# Patient Record
Sex: Female | Born: 1935 | Race: White | Hispanic: No | Smoking: Never smoker
Health system: Southern US, Community
[De-identification: ages and names within clinical notes are randomized; demographics above are authoritative.]

## PROBLEM LIST (undated history)

## (undated) DIAGNOSIS — I639 Cerebral infarction, unspecified: Secondary | ICD-10-CM

## (undated) DIAGNOSIS — I10 Essential (primary) hypertension: Secondary | ICD-10-CM

## (undated) DIAGNOSIS — E119 Type 2 diabetes mellitus without complications: Secondary | ICD-10-CM

## (undated) HISTORY — PX: EYE SURGERY: SHX253

## (undated) HISTORY — PX: ABDOMINAL HYSTERECTOMY: SHX81

## (undated) HISTORY — PX: CHOLECYSTECTOMY: SHX55

## (undated) HISTORY — PX: TONSILLECTOMY: SUR1361

---

## 2014-02-20 DIAGNOSIS — E119 Type 2 diabetes mellitus without complications: Secondary | ICD-10-CM | POA: Diagnosis not present

## 2014-02-20 DIAGNOSIS — D539 Nutritional anemia, unspecified: Secondary | ICD-10-CM | POA: Diagnosis not present

## 2014-04-25 DIAGNOSIS — E119 Type 2 diabetes mellitus without complications: Secondary | ICD-10-CM | POA: Diagnosis not present

## 2014-05-01 DIAGNOSIS — I10 Essential (primary) hypertension: Secondary | ICD-10-CM | POA: Diagnosis not present

## 2014-05-01 DIAGNOSIS — M810 Age-related osteoporosis without current pathological fracture: Secondary | ICD-10-CM | POA: Diagnosis not present

## 2014-05-01 DIAGNOSIS — E1143 Type 2 diabetes mellitus with diabetic autonomic (poly)neuropathy: Secondary | ICD-10-CM | POA: Diagnosis not present

## 2014-05-01 DIAGNOSIS — E780 Pure hypercholesterolemia: Secondary | ICD-10-CM | POA: Diagnosis not present

## 2014-06-06 DIAGNOSIS — Z0000 Encounter for general adult medical examination without abnormal findings: Secondary | ICD-10-CM | POA: Diagnosis not present

## 2014-06-06 DIAGNOSIS — E1143 Type 2 diabetes mellitus with diabetic autonomic (poly)neuropathy: Secondary | ICD-10-CM | POA: Diagnosis not present

## 2014-06-06 DIAGNOSIS — Z1389 Encounter for screening for other disorder: Secondary | ICD-10-CM | POA: Diagnosis not present

## 2014-06-06 DIAGNOSIS — Z9181 History of falling: Secondary | ICD-10-CM | POA: Diagnosis not present

## 2014-06-06 DIAGNOSIS — E780 Pure hypercholesterolemia: Secondary | ICD-10-CM | POA: Diagnosis not present

## 2014-06-21 DIAGNOSIS — I421 Obstructive hypertrophic cardiomyopathy: Secondary | ICD-10-CM | POA: Diagnosis not present

## 2014-06-21 DIAGNOSIS — E119 Type 2 diabetes mellitus without complications: Secondary | ICD-10-CM | POA: Diagnosis not present

## 2014-06-29 DIAGNOSIS — I429 Cardiomyopathy, unspecified: Secondary | ICD-10-CM | POA: Diagnosis not present

## 2014-07-06 DIAGNOSIS — I429 Cardiomyopathy, unspecified: Secondary | ICD-10-CM | POA: Diagnosis not present

## 2014-07-10 DIAGNOSIS — I421 Obstructive hypertrophic cardiomyopathy: Secondary | ICD-10-CM | POA: Diagnosis not present

## 2014-07-10 DIAGNOSIS — E119 Type 2 diabetes mellitus without complications: Secondary | ICD-10-CM | POA: Diagnosis not present

## 2014-07-10 DIAGNOSIS — I429 Cardiomyopathy, unspecified: Secondary | ICD-10-CM | POA: Diagnosis not present

## 2014-07-25 DIAGNOSIS — E119 Type 2 diabetes mellitus without complications: Secondary | ICD-10-CM | POA: Diagnosis not present

## 2014-07-26 DIAGNOSIS — E1165 Type 2 diabetes mellitus with hyperglycemia: Secondary | ICD-10-CM | POA: Diagnosis not present

## 2014-07-26 DIAGNOSIS — E780 Pure hypercholesterolemia: Secondary | ICD-10-CM | POA: Diagnosis not present

## 2014-07-26 DIAGNOSIS — I10 Essential (primary) hypertension: Secondary | ICD-10-CM | POA: Diagnosis not present

## 2014-07-26 DIAGNOSIS — E871 Hypo-osmolality and hyponatremia: Secondary | ICD-10-CM | POA: Diagnosis not present

## 2014-07-31 DIAGNOSIS — E039 Hypothyroidism, unspecified: Secondary | ICD-10-CM | POA: Diagnosis not present

## 2014-07-31 DIAGNOSIS — E871 Hypo-osmolality and hyponatremia: Secondary | ICD-10-CM | POA: Diagnosis not present

## 2014-07-31 DIAGNOSIS — I8310 Varicose veins of unspecified lower extremity with inflammation: Secondary | ICD-10-CM | POA: Diagnosis not present

## 2014-08-08 DIAGNOSIS — I421 Obstructive hypertrophic cardiomyopathy: Secondary | ICD-10-CM | POA: Diagnosis not present

## 2014-08-08 DIAGNOSIS — E119 Type 2 diabetes mellitus without complications: Secondary | ICD-10-CM | POA: Diagnosis not present

## 2014-08-08 DIAGNOSIS — I10 Essential (primary) hypertension: Secondary | ICD-10-CM | POA: Diagnosis not present

## 2014-08-08 DIAGNOSIS — I429 Cardiomyopathy, unspecified: Secondary | ICD-10-CM | POA: Diagnosis not present

## 2014-08-14 DIAGNOSIS — I350 Nonrheumatic aortic (valve) stenosis: Secondary | ICD-10-CM | POA: Diagnosis not present

## 2014-08-14 DIAGNOSIS — I340 Nonrheumatic mitral (valve) insufficiency: Secondary | ICD-10-CM | POA: Diagnosis not present

## 2014-08-14 DIAGNOSIS — I517 Cardiomegaly: Secondary | ICD-10-CM | POA: Diagnosis not present

## 2014-08-14 DIAGNOSIS — I10 Essential (primary) hypertension: Secondary | ICD-10-CM | POA: Diagnosis not present

## 2014-08-28 DIAGNOSIS — I10 Essential (primary) hypertension: Secondary | ICD-10-CM | POA: Diagnosis not present

## 2014-08-28 DIAGNOSIS — E1143 Type 2 diabetes mellitus with diabetic autonomic (poly)neuropathy: Secondary | ICD-10-CM | POA: Diagnosis not present

## 2014-08-28 DIAGNOSIS — E780 Pure hypercholesterolemia: Secondary | ICD-10-CM | POA: Diagnosis not present

## 2014-09-15 DIAGNOSIS — L049 Acute lymphadenitis, unspecified: Secondary | ICD-10-CM | POA: Diagnosis not present

## 2014-10-31 DIAGNOSIS — I10 Essential (primary) hypertension: Secondary | ICD-10-CM | POA: Diagnosis not present

## 2014-10-31 DIAGNOSIS — E780 Pure hypercholesterolemia: Secondary | ICD-10-CM | POA: Diagnosis not present

## 2014-10-31 DIAGNOSIS — Z23 Encounter for immunization: Secondary | ICD-10-CM | POA: Diagnosis not present

## 2014-10-31 DIAGNOSIS — E1165 Type 2 diabetes mellitus with hyperglycemia: Secondary | ICD-10-CM | POA: Diagnosis not present

## 2014-11-03 DIAGNOSIS — I10 Essential (primary) hypertension: Secondary | ICD-10-CM | POA: Diagnosis not present

## 2014-11-09 DIAGNOSIS — I10 Essential (primary) hypertension: Secondary | ICD-10-CM | POA: Diagnosis not present

## 2014-11-17 DIAGNOSIS — I10 Essential (primary) hypertension: Secondary | ICD-10-CM | POA: Diagnosis not present

## 2014-11-17 DIAGNOSIS — E1165 Type 2 diabetes mellitus with hyperglycemia: Secondary | ICD-10-CM | POA: Diagnosis not present

## 2014-11-17 DIAGNOSIS — N189 Chronic kidney disease, unspecified: Secondary | ICD-10-CM | POA: Diagnosis not present

## 2014-11-24 DIAGNOSIS — E871 Hypo-osmolality and hyponatremia: Secondary | ICD-10-CM | POA: Diagnosis not present

## 2014-11-27 DIAGNOSIS — N289 Disorder of kidney and ureter, unspecified: Secondary | ICD-10-CM | POA: Diagnosis not present

## 2014-11-27 DIAGNOSIS — N390 Urinary tract infection, site not specified: Secondary | ICD-10-CM | POA: Diagnosis not present

## 2014-11-27 DIAGNOSIS — G9341 Metabolic encephalopathy: Secondary | ICD-10-CM | POA: Diagnosis not present

## 2014-11-28 DIAGNOSIS — R531 Weakness: Secondary | ICD-10-CM | POA: Diagnosis not present

## 2014-11-28 DIAGNOSIS — R7989 Other specified abnormal findings of blood chemistry: Secondary | ICD-10-CM | POA: Diagnosis not present

## 2014-11-28 DIAGNOSIS — N170 Acute kidney failure with tubular necrosis: Secondary | ICD-10-CM | POA: Diagnosis not present

## 2014-11-28 DIAGNOSIS — N390 Urinary tract infection, site not specified: Secondary | ICD-10-CM | POA: Diagnosis not present

## 2014-11-28 DIAGNOSIS — I10 Essential (primary) hypertension: Secondary | ICD-10-CM | POA: Diagnosis not present

## 2014-11-28 DIAGNOSIS — N289 Disorder of kidney and ureter, unspecified: Secondary | ICD-10-CM | POA: Diagnosis not present

## 2014-11-28 DIAGNOSIS — I5022 Chronic systolic (congestive) heart failure: Secondary | ICD-10-CM | POA: Diagnosis not present

## 2014-11-28 DIAGNOSIS — G9341 Metabolic encephalopathy: Secondary | ICD-10-CM | POA: Diagnosis not present

## 2014-11-28 DIAGNOSIS — E785 Hyperlipidemia, unspecified: Secondary | ICD-10-CM | POA: Diagnosis not present

## 2014-11-28 DIAGNOSIS — R262 Difficulty in walking, not elsewhere classified: Secondary | ICD-10-CM | POA: Diagnosis not present

## 2014-11-28 DIAGNOSIS — E876 Hypokalemia: Secondary | ICD-10-CM | POA: Diagnosis not present

## 2014-12-02 DIAGNOSIS — I5022 Chronic systolic (congestive) heart failure: Secondary | ICD-10-CM | POA: Diagnosis not present

## 2014-12-02 DIAGNOSIS — E119 Type 2 diabetes mellitus without complications: Secondary | ICD-10-CM | POA: Diagnosis not present

## 2014-12-02 DIAGNOSIS — Z7984 Long term (current) use of oral hypoglycemic drugs: Secondary | ICD-10-CM | POA: Diagnosis not present

## 2014-12-02 DIAGNOSIS — I10 Essential (primary) hypertension: Secondary | ICD-10-CM | POA: Diagnosis not present

## 2014-12-02 DIAGNOSIS — R269 Unspecified abnormalities of gait and mobility: Secondary | ICD-10-CM | POA: Diagnosis not present

## 2014-12-03 DIAGNOSIS — R269 Unspecified abnormalities of gait and mobility: Secondary | ICD-10-CM | POA: Diagnosis not present

## 2014-12-03 DIAGNOSIS — E119 Type 2 diabetes mellitus without complications: Secondary | ICD-10-CM | POA: Diagnosis not present

## 2014-12-03 DIAGNOSIS — Z7984 Long term (current) use of oral hypoglycemic drugs: Secondary | ICD-10-CM | POA: Diagnosis not present

## 2014-12-03 DIAGNOSIS — I10 Essential (primary) hypertension: Secondary | ICD-10-CM | POA: Diagnosis not present

## 2014-12-03 DIAGNOSIS — I5022 Chronic systolic (congestive) heart failure: Secondary | ICD-10-CM | POA: Diagnosis not present

## 2014-12-04 DIAGNOSIS — I5022 Chronic systolic (congestive) heart failure: Secondary | ICD-10-CM | POA: Diagnosis not present

## 2014-12-04 DIAGNOSIS — R269 Unspecified abnormalities of gait and mobility: Secondary | ICD-10-CM | POA: Diagnosis not present

## 2014-12-04 DIAGNOSIS — I10 Essential (primary) hypertension: Secondary | ICD-10-CM | POA: Diagnosis not present

## 2014-12-04 DIAGNOSIS — Z7984 Long term (current) use of oral hypoglycemic drugs: Secondary | ICD-10-CM | POA: Diagnosis not present

## 2014-12-04 DIAGNOSIS — E119 Type 2 diabetes mellitus without complications: Secondary | ICD-10-CM | POA: Diagnosis not present

## 2014-12-05 DIAGNOSIS — R269 Unspecified abnormalities of gait and mobility: Secondary | ICD-10-CM | POA: Diagnosis not present

## 2014-12-05 DIAGNOSIS — Z7984 Long term (current) use of oral hypoglycemic drugs: Secondary | ICD-10-CM | POA: Diagnosis not present

## 2014-12-05 DIAGNOSIS — I5022 Chronic systolic (congestive) heart failure: Secondary | ICD-10-CM | POA: Diagnosis not present

## 2014-12-05 DIAGNOSIS — I10 Essential (primary) hypertension: Secondary | ICD-10-CM | POA: Diagnosis not present

## 2014-12-05 DIAGNOSIS — E119 Type 2 diabetes mellitus without complications: Secondary | ICD-10-CM | POA: Diagnosis not present

## 2014-12-06 DIAGNOSIS — E1143 Type 2 diabetes mellitus with diabetic autonomic (poly)neuropathy: Secondary | ICD-10-CM | POA: Diagnosis not present

## 2014-12-06 DIAGNOSIS — K219 Gastro-esophageal reflux disease without esophagitis: Secondary | ICD-10-CM | POA: Diagnosis not present

## 2014-12-06 DIAGNOSIS — E119 Type 2 diabetes mellitus without complications: Secondary | ICD-10-CM | POA: Diagnosis not present

## 2014-12-06 DIAGNOSIS — N390 Urinary tract infection, site not specified: Secondary | ICD-10-CM | POA: Diagnosis not present

## 2014-12-06 DIAGNOSIS — I5022 Chronic systolic (congestive) heart failure: Secondary | ICD-10-CM | POA: Diagnosis not present

## 2014-12-06 DIAGNOSIS — I10 Essential (primary) hypertension: Secondary | ICD-10-CM | POA: Diagnosis not present

## 2014-12-06 DIAGNOSIS — R269 Unspecified abnormalities of gait and mobility: Secondary | ICD-10-CM | POA: Diagnosis not present

## 2014-12-06 DIAGNOSIS — Z7984 Long term (current) use of oral hypoglycemic drugs: Secondary | ICD-10-CM | POA: Diagnosis not present

## 2014-12-07 DIAGNOSIS — E119 Type 2 diabetes mellitus without complications: Secondary | ICD-10-CM | POA: Diagnosis not present

## 2014-12-07 DIAGNOSIS — I5022 Chronic systolic (congestive) heart failure: Secondary | ICD-10-CM | POA: Diagnosis not present

## 2014-12-07 DIAGNOSIS — R269 Unspecified abnormalities of gait and mobility: Secondary | ICD-10-CM | POA: Diagnosis not present

## 2014-12-07 DIAGNOSIS — I10 Essential (primary) hypertension: Secondary | ICD-10-CM | POA: Diagnosis not present

## 2014-12-07 DIAGNOSIS — Z7984 Long term (current) use of oral hypoglycemic drugs: Secondary | ICD-10-CM | POA: Diagnosis not present

## 2014-12-11 DIAGNOSIS — I10 Essential (primary) hypertension: Secondary | ICD-10-CM | POA: Diagnosis not present

## 2014-12-11 DIAGNOSIS — E119 Type 2 diabetes mellitus without complications: Secondary | ICD-10-CM | POA: Diagnosis not present

## 2014-12-11 DIAGNOSIS — I5022 Chronic systolic (congestive) heart failure: Secondary | ICD-10-CM | POA: Diagnosis not present

## 2014-12-11 DIAGNOSIS — Z7984 Long term (current) use of oral hypoglycemic drugs: Secondary | ICD-10-CM | POA: Diagnosis not present

## 2014-12-11 DIAGNOSIS — R269 Unspecified abnormalities of gait and mobility: Secondary | ICD-10-CM | POA: Diagnosis not present

## 2014-12-12 DIAGNOSIS — N189 Chronic kidney disease, unspecified: Secondary | ICD-10-CM | POA: Diagnosis not present

## 2014-12-13 DIAGNOSIS — E119 Type 2 diabetes mellitus without complications: Secondary | ICD-10-CM | POA: Diagnosis not present

## 2014-12-13 DIAGNOSIS — I10 Essential (primary) hypertension: Secondary | ICD-10-CM | POA: Diagnosis not present

## 2014-12-13 DIAGNOSIS — R269 Unspecified abnormalities of gait and mobility: Secondary | ICD-10-CM | POA: Diagnosis not present

## 2014-12-13 DIAGNOSIS — Z7984 Long term (current) use of oral hypoglycemic drugs: Secondary | ICD-10-CM | POA: Diagnosis not present

## 2014-12-13 DIAGNOSIS — I5022 Chronic systolic (congestive) heart failure: Secondary | ICD-10-CM | POA: Diagnosis not present

## 2014-12-14 DIAGNOSIS — R269 Unspecified abnormalities of gait and mobility: Secondary | ICD-10-CM | POA: Diagnosis not present

## 2014-12-14 DIAGNOSIS — I10 Essential (primary) hypertension: Secondary | ICD-10-CM | POA: Diagnosis not present

## 2014-12-14 DIAGNOSIS — I5022 Chronic systolic (congestive) heart failure: Secondary | ICD-10-CM | POA: Diagnosis not present

## 2014-12-14 DIAGNOSIS — E119 Type 2 diabetes mellitus without complications: Secondary | ICD-10-CM | POA: Diagnosis not present

## 2014-12-14 DIAGNOSIS — Z7984 Long term (current) use of oral hypoglycemic drugs: Secondary | ICD-10-CM | POA: Diagnosis not present

## 2014-12-18 DIAGNOSIS — R269 Unspecified abnormalities of gait and mobility: Secondary | ICD-10-CM | POA: Diagnosis not present

## 2014-12-18 DIAGNOSIS — Z7984 Long term (current) use of oral hypoglycemic drugs: Secondary | ICD-10-CM | POA: Diagnosis not present

## 2014-12-18 DIAGNOSIS — I10 Essential (primary) hypertension: Secondary | ICD-10-CM | POA: Diagnosis not present

## 2014-12-18 DIAGNOSIS — E119 Type 2 diabetes mellitus without complications: Secondary | ICD-10-CM | POA: Diagnosis not present

## 2014-12-18 DIAGNOSIS — I5022 Chronic systolic (congestive) heart failure: Secondary | ICD-10-CM | POA: Diagnosis not present

## 2014-12-19 DIAGNOSIS — I10 Essential (primary) hypertension: Secondary | ICD-10-CM | POA: Diagnosis not present

## 2014-12-19 DIAGNOSIS — E119 Type 2 diabetes mellitus without complications: Secondary | ICD-10-CM | POA: Diagnosis not present

## 2014-12-19 DIAGNOSIS — I5022 Chronic systolic (congestive) heart failure: Secondary | ICD-10-CM | POA: Diagnosis not present

## 2014-12-19 DIAGNOSIS — R269 Unspecified abnormalities of gait and mobility: Secondary | ICD-10-CM | POA: Diagnosis not present

## 2014-12-19 DIAGNOSIS — E872 Acidosis: Secondary | ICD-10-CM | POA: Diagnosis not present

## 2014-12-19 DIAGNOSIS — Z7984 Long term (current) use of oral hypoglycemic drugs: Secondary | ICD-10-CM | POA: Diagnosis not present

## 2014-12-25 DIAGNOSIS — E119 Type 2 diabetes mellitus without complications: Secondary | ICD-10-CM | POA: Diagnosis not present

## 2014-12-25 DIAGNOSIS — I5022 Chronic systolic (congestive) heart failure: Secondary | ICD-10-CM | POA: Diagnosis not present

## 2015-01-30 DIAGNOSIS — E1165 Type 2 diabetes mellitus with hyperglycemia: Secondary | ICD-10-CM | POA: Diagnosis not present

## 2015-01-31 DIAGNOSIS — N2581 Secondary hyperparathyroidism of renal origin: Secondary | ICD-10-CM | POA: Diagnosis not present

## 2015-01-31 DIAGNOSIS — R809 Proteinuria, unspecified: Secondary | ICD-10-CM | POA: Diagnosis not present

## 2015-01-31 DIAGNOSIS — N189 Chronic kidney disease, unspecified: Secondary | ICD-10-CM | POA: Diagnosis not present

## 2015-01-31 DIAGNOSIS — D631 Anemia in chronic kidney disease: Secondary | ICD-10-CM | POA: Diagnosis not present

## 2015-01-31 DIAGNOSIS — E1122 Type 2 diabetes mellitus with diabetic chronic kidney disease: Secondary | ICD-10-CM | POA: Diagnosis not present

## 2015-02-06 DIAGNOSIS — I10 Essential (primary) hypertension: Secondary | ICD-10-CM | POA: Diagnosis not present

## 2015-02-06 DIAGNOSIS — E7800 Pure hypercholesterolemia, unspecified: Secondary | ICD-10-CM | POA: Diagnosis not present

## 2015-02-06 DIAGNOSIS — N189 Chronic kidney disease, unspecified: Secondary | ICD-10-CM | POA: Diagnosis not present

## 2015-02-06 DIAGNOSIS — E1165 Type 2 diabetes mellitus with hyperglycemia: Secondary | ICD-10-CM | POA: Diagnosis not present

## 2015-02-08 ENCOUNTER — Other Ambulatory Visit: Payer: Self-pay | Admitting: Nephrology

## 2015-02-08 DIAGNOSIS — E1322 Other specified diabetes mellitus with diabetic chronic kidney disease: Secondary | ICD-10-CM

## 2015-02-08 DIAGNOSIS — N189 Chronic kidney disease, unspecified: Secondary | ICD-10-CM

## 2015-02-21 DIAGNOSIS — Z79899 Other long term (current) drug therapy: Secondary | ICD-10-CM | POA: Diagnosis not present

## 2015-03-27 ENCOUNTER — Ambulatory Visit
Admission: RE | Admit: 2015-03-27 | Discharge: 2015-03-27 | Disposition: A | Discharge status: Home / Self Care | Payer: Commercial Managed Care - HMO | Source: Ambulatory Visit | Attending: Nephrology | Admitting: Nephrology

## 2015-03-27 DIAGNOSIS — E1322 Other specified diabetes mellitus with diabetic chronic kidney disease: Secondary | ICD-10-CM

## 2015-03-27 DIAGNOSIS — N189 Chronic kidney disease, unspecified: Secondary | ICD-10-CM

## 2015-04-19 DIAGNOSIS — I10 Essential (primary) hypertension: Secondary | ICD-10-CM | POA: Diagnosis not present

## 2015-04-19 DIAGNOSIS — M170 Bilateral primary osteoarthritis of knee: Secondary | ICD-10-CM | POA: Diagnosis not present

## 2015-04-19 DIAGNOSIS — I5022 Chronic systolic (congestive) heart failure: Secondary | ICD-10-CM | POA: Diagnosis not present

## 2015-04-19 DIAGNOSIS — E782 Mixed hyperlipidemia: Secondary | ICD-10-CM | POA: Diagnosis not present

## 2015-04-19 DIAGNOSIS — E119 Type 2 diabetes mellitus without complications: Secondary | ICD-10-CM | POA: Diagnosis not present

## 2015-04-19 DIAGNOSIS — K219 Gastro-esophageal reflux disease without esophagitis: Secondary | ICD-10-CM | POA: Diagnosis not present

## 2015-04-19 DIAGNOSIS — R413 Other amnesia: Secondary | ICD-10-CM | POA: Diagnosis not present

## 2015-04-26 DIAGNOSIS — H903 Sensorineural hearing loss, bilateral: Secondary | ICD-10-CM | POA: Diagnosis not present

## 2015-05-04 DIAGNOSIS — D631 Anemia in chronic kidney disease: Secondary | ICD-10-CM | POA: Diagnosis not present

## 2015-05-04 DIAGNOSIS — R809 Proteinuria, unspecified: Secondary | ICD-10-CM | POA: Diagnosis not present

## 2015-05-04 DIAGNOSIS — N2581 Secondary hyperparathyroidism of renal origin: Secondary | ICD-10-CM | POA: Diagnosis not present

## 2015-05-04 DIAGNOSIS — E1122 Type 2 diabetes mellitus with diabetic chronic kidney disease: Secondary | ICD-10-CM | POA: Diagnosis not present

## 2015-05-04 DIAGNOSIS — N189 Chronic kidney disease, unspecified: Secondary | ICD-10-CM | POA: Diagnosis not present

## 2015-05-14 DIAGNOSIS — E782 Mixed hyperlipidemia: Secondary | ICD-10-CM | POA: Diagnosis not present

## 2015-05-14 DIAGNOSIS — E119 Type 2 diabetes mellitus without complications: Secondary | ICD-10-CM | POA: Diagnosis not present

## 2015-05-14 DIAGNOSIS — I10 Essential (primary) hypertension: Secondary | ICD-10-CM | POA: Diagnosis not present

## 2015-06-05 DIAGNOSIS — I10 Essential (primary) hypertension: Secondary | ICD-10-CM | POA: Diagnosis not present

## 2015-06-05 DIAGNOSIS — Z7984 Long term (current) use of oral hypoglycemic drugs: Secondary | ICD-10-CM | POA: Diagnosis not present

## 2015-06-05 DIAGNOSIS — H5203 Hypermetropia, bilateral: Secondary | ICD-10-CM | POA: Diagnosis not present

## 2015-06-05 DIAGNOSIS — H2703 Aphakia, bilateral: Secondary | ICD-10-CM | POA: Diagnosis not present

## 2015-06-05 DIAGNOSIS — H52223 Regular astigmatism, bilateral: Secondary | ICD-10-CM | POA: Diagnosis not present

## 2015-06-05 DIAGNOSIS — H35373 Puckering of macula, bilateral: Secondary | ICD-10-CM | POA: Diagnosis not present

## 2015-06-05 DIAGNOSIS — H524 Presbyopia: Secondary | ICD-10-CM | POA: Diagnosis not present

## 2015-06-05 DIAGNOSIS — E119 Type 2 diabetes mellitus without complications: Secondary | ICD-10-CM | POA: Diagnosis not present

## 2015-06-12 DIAGNOSIS — E1165 Type 2 diabetes mellitus with hyperglycemia: Secondary | ICD-10-CM | POA: Diagnosis not present

## 2015-06-12 DIAGNOSIS — N183 Chronic kidney disease, stage 3 (moderate): Secondary | ICD-10-CM | POA: Diagnosis not present

## 2015-06-12 DIAGNOSIS — I10 Essential (primary) hypertension: Secondary | ICD-10-CM | POA: Diagnosis not present

## 2015-06-12 DIAGNOSIS — E119 Type 2 diabetes mellitus without complications: Secondary | ICD-10-CM | POA: Diagnosis not present

## 2015-09-14 DIAGNOSIS — E119 Type 2 diabetes mellitus without complications: Secondary | ICD-10-CM | POA: Diagnosis not present

## 2015-09-14 DIAGNOSIS — D485 Neoplasm of uncertain behavior of skin: Secondary | ICD-10-CM | POA: Diagnosis not present

## 2015-09-14 DIAGNOSIS — R413 Other amnesia: Secondary | ICD-10-CM | POA: Diagnosis not present

## 2015-09-14 DIAGNOSIS — I10 Essential (primary) hypertension: Secondary | ICD-10-CM | POA: Diagnosis not present

## 2015-09-14 DIAGNOSIS — E782 Mixed hyperlipidemia: Secondary | ICD-10-CM | POA: Diagnosis not present

## 2015-10-11 DIAGNOSIS — R413 Other amnesia: Secondary | ICD-10-CM | POA: Diagnosis not present

## 2015-10-11 DIAGNOSIS — G319 Degenerative disease of nervous system, unspecified: Secondary | ICD-10-CM | POA: Diagnosis not present

## 2015-10-29 DIAGNOSIS — E119 Type 2 diabetes mellitus without complications: Secondary | ICD-10-CM | POA: Diagnosis not present

## 2015-10-29 DIAGNOSIS — G301 Alzheimer's disease with late onset: Secondary | ICD-10-CM | POA: Diagnosis not present

## 2015-10-29 DIAGNOSIS — I10 Essential (primary) hypertension: Secondary | ICD-10-CM | POA: Diagnosis not present

## 2015-10-29 DIAGNOSIS — E782 Mixed hyperlipidemia: Secondary | ICD-10-CM | POA: Diagnosis not present

## 2015-10-29 DIAGNOSIS — Z23 Encounter for immunization: Secondary | ICD-10-CM | POA: Diagnosis not present

## 2015-12-04 DIAGNOSIS — E119 Type 2 diabetes mellitus without complications: Secondary | ICD-10-CM | POA: Diagnosis not present

## 2015-12-04 DIAGNOSIS — G301 Alzheimer's disease with late onset: Secondary | ICD-10-CM | POA: Diagnosis not present

## 2016-01-08 DIAGNOSIS — M170 Bilateral primary osteoarthritis of knee: Secondary | ICD-10-CM | POA: Diagnosis not present

## 2016-01-08 DIAGNOSIS — I10 Essential (primary) hypertension: Secondary | ICD-10-CM | POA: Diagnosis not present

## 2016-01-08 DIAGNOSIS — G301 Alzheimer's disease with late onset: Secondary | ICD-10-CM | POA: Diagnosis not present

## 2016-01-08 DIAGNOSIS — E119 Type 2 diabetes mellitus without complications: Secondary | ICD-10-CM | POA: Diagnosis not present

## 2016-01-21 DIAGNOSIS — Z7982 Long term (current) use of aspirin: Secondary | ICD-10-CM | POA: Diagnosis not present

## 2016-01-21 DIAGNOSIS — I517 Cardiomegaly: Secondary | ICD-10-CM | POA: Diagnosis not present

## 2016-01-21 DIAGNOSIS — R4182 Altered mental status, unspecified: Secondary | ICD-10-CM | POA: Diagnosis not present

## 2016-01-21 DIAGNOSIS — E118 Type 2 diabetes mellitus with unspecified complications: Secondary | ICD-10-CM | POA: Diagnosis not present

## 2016-01-21 DIAGNOSIS — I110 Hypertensive heart disease with heart failure: Secondary | ICD-10-CM | POA: Diagnosis not present

## 2016-01-21 DIAGNOSIS — I361 Nonrheumatic tricuspid (valve) insufficiency: Secondary | ICD-10-CM | POA: Diagnosis not present

## 2016-01-21 DIAGNOSIS — E1140 Type 2 diabetes mellitus with diabetic neuropathy, unspecified: Secondary | ICD-10-CM | POA: Diagnosis not present

## 2016-01-21 DIAGNOSIS — I6789 Other cerebrovascular disease: Secondary | ICD-10-CM | POA: Diagnosis not present

## 2016-01-21 DIAGNOSIS — I63031 Cerebral infarction due to thrombosis of right carotid artery: Secondary | ICD-10-CM | POA: Diagnosis not present

## 2016-01-21 DIAGNOSIS — Z794 Long term (current) use of insulin: Secondary | ICD-10-CM | POA: Diagnosis not present

## 2016-01-21 DIAGNOSIS — R531 Weakness: Secondary | ICD-10-CM | POA: Diagnosis not present

## 2016-01-21 DIAGNOSIS — I639 Cerebral infarction, unspecified: Secondary | ICD-10-CM | POA: Diagnosis not present

## 2016-01-21 DIAGNOSIS — I63033 Cerebral infarction due to thrombosis of bilateral carotid arteries: Secondary | ICD-10-CM | POA: Diagnosis not present

## 2016-01-21 DIAGNOSIS — I491 Atrial premature depolarization: Secondary | ICD-10-CM | POA: Diagnosis not present

## 2016-01-21 DIAGNOSIS — R29818 Other symptoms and signs involving the nervous system: Secondary | ICD-10-CM | POA: Diagnosis not present

## 2016-01-21 DIAGNOSIS — I5022 Chronic systolic (congestive) heart failure: Secondary | ICD-10-CM | POA: Diagnosis not present

## 2016-01-21 DIAGNOSIS — I63013 Cerebral infarction due to thrombosis of bilateral vertebral arteries: Secondary | ICD-10-CM | POA: Diagnosis not present

## 2016-01-21 DIAGNOSIS — I119 Hypertensive heart disease without heart failure: Secondary | ICD-10-CM | POA: Diagnosis not present

## 2016-01-21 DIAGNOSIS — I63032 Cerebral infarction due to thrombosis of left carotid artery: Secondary | ICD-10-CM | POA: Diagnosis not present

## 2016-01-21 DIAGNOSIS — Z823 Family history of stroke: Secondary | ICD-10-CM | POA: Diagnosis not present

## 2016-01-21 DIAGNOSIS — E119 Type 2 diabetes mellitus without complications: Secondary | ICD-10-CM | POA: Diagnosis not present

## 2016-01-21 DIAGNOSIS — I5032 Chronic diastolic (congestive) heart failure: Secondary | ICD-10-CM | POA: Diagnosis not present

## 2016-01-21 DIAGNOSIS — Z79899 Other long term (current) drug therapy: Secondary | ICD-10-CM | POA: Diagnosis not present

## 2016-01-21 DIAGNOSIS — I10 Essential (primary) hypertension: Secondary | ICD-10-CM | POA: Diagnosis not present

## 2016-01-21 DIAGNOSIS — I350 Nonrheumatic aortic (valve) stenosis: Secondary | ICD-10-CM | POA: Diagnosis not present

## 2016-01-21 DIAGNOSIS — I447 Left bundle-branch block, unspecified: Secondary | ICD-10-CM | POA: Diagnosis not present

## 2016-01-21 DIAGNOSIS — E785 Hyperlipidemia, unspecified: Secondary | ICD-10-CM | POA: Diagnosis not present

## 2016-01-22 DIAGNOSIS — E785 Hyperlipidemia, unspecified: Secondary | ICD-10-CM

## 2016-01-22 DIAGNOSIS — R001 Bradycardia, unspecified: Secondary | ICD-10-CM

## 2016-01-23 DIAGNOSIS — I5022 Chronic systolic (congestive) heart failure: Secondary | ICD-10-CM | POA: Diagnosis not present

## 2016-01-23 DIAGNOSIS — I10 Essential (primary) hypertension: Secondary | ICD-10-CM | POA: Diagnosis not present

## 2016-01-23 DIAGNOSIS — E119 Type 2 diabetes mellitus without complications: Secondary | ICD-10-CM | POA: Diagnosis not present

## 2016-01-23 DIAGNOSIS — E785 Hyperlipidemia, unspecified: Secondary | ICD-10-CM

## 2016-01-23 DIAGNOSIS — I639 Cerebral infarction, unspecified: Secondary | ICD-10-CM | POA: Diagnosis not present

## 2016-01-23 DIAGNOSIS — R001 Bradycardia, unspecified: Secondary | ICD-10-CM

## 2016-01-24 ENCOUNTER — Inpatient Hospital Stay (HOSPITAL_COMMUNITY)
Admission: AD | Admit: 2016-01-24 | Discharge: 2016-01-29 | DRG: 065 | Disposition: A | Discharge status: Skilled Nursing Facility | Payer: Commercial Managed Care - HMO | Source: Other Acute Inpatient Hospital | Attending: Internal Medicine | Admitting: Internal Medicine

## 2016-01-24 ENCOUNTER — Encounter (HOSPITAL_COMMUNITY): Payer: Self-pay | Admitting: Internal Medicine

## 2016-01-24 ENCOUNTER — Inpatient Hospital Stay (HOSPITAL_COMMUNITY): Payer: Commercial Managed Care - HMO

## 2016-01-24 DIAGNOSIS — Z833 Family history of diabetes mellitus: Secondary | ICD-10-CM

## 2016-01-24 DIAGNOSIS — I63512 Cerebral infarction due to unspecified occlusion or stenosis of left middle cerebral artery: Secondary | ICD-10-CM | POA: Diagnosis not present

## 2016-01-24 DIAGNOSIS — Z880 Allergy status to penicillin: Secondary | ICD-10-CM | POA: Diagnosis not present

## 2016-01-24 DIAGNOSIS — E1142 Type 2 diabetes mellitus with diabetic polyneuropathy: Secondary | ICD-10-CM | POA: Diagnosis present

## 2016-01-24 DIAGNOSIS — I639 Cerebral infarction, unspecified: Secondary | ICD-10-CM | POA: Diagnosis not present

## 2016-01-24 DIAGNOSIS — I5022 Chronic systolic (congestive) heart failure: Secondary | ICD-10-CM | POA: Diagnosis not present

## 2016-01-24 DIAGNOSIS — E785 Hyperlipidemia, unspecified: Secondary | ICD-10-CM | POA: Diagnosis not present

## 2016-01-24 DIAGNOSIS — Z9049 Acquired absence of other specified parts of digestive tract: Secondary | ICD-10-CM | POA: Diagnosis not present

## 2016-01-24 DIAGNOSIS — Z7902 Long term (current) use of antithrombotics/antiplatelets: Secondary | ICD-10-CM | POA: Diagnosis not present

## 2016-01-24 DIAGNOSIS — F0150 Vascular dementia without behavioral disturbance: Secondary | ICD-10-CM | POA: Diagnosis not present

## 2016-01-24 DIAGNOSIS — I6501 Occlusion and stenosis of right vertebral artery: Secondary | ICD-10-CM | POA: Diagnosis not present

## 2016-01-24 DIAGNOSIS — I6523 Occlusion and stenosis of bilateral carotid arteries: Secondary | ICD-10-CM | POA: Diagnosis present

## 2016-01-24 DIAGNOSIS — G8191 Hemiplegia, unspecified affecting right dominant side: Secondary | ICD-10-CM | POA: Diagnosis not present

## 2016-01-24 DIAGNOSIS — Z90710 Acquired absence of both cervix and uterus: Secondary | ICD-10-CM

## 2016-01-24 DIAGNOSIS — R531 Weakness: Secondary | ICD-10-CM | POA: Diagnosis not present

## 2016-01-24 DIAGNOSIS — E1159 Type 2 diabetes mellitus with other circulatory complications: Secondary | ICD-10-CM | POA: Diagnosis not present

## 2016-01-24 DIAGNOSIS — Z888 Allergy status to other drugs, medicaments and biological substances status: Secondary | ICD-10-CM

## 2016-01-24 DIAGNOSIS — Z66 Do not resuscitate: Secondary | ICD-10-CM | POA: Diagnosis present

## 2016-01-24 DIAGNOSIS — I110 Hypertensive heart disease with heart failure: Secondary | ICD-10-CM | POA: Diagnosis not present

## 2016-01-24 DIAGNOSIS — Z79899 Other long term (current) drug therapy: Secondary | ICD-10-CM

## 2016-01-24 DIAGNOSIS — I10 Essential (primary) hypertension: Secondary | ICD-10-CM | POA: Diagnosis not present

## 2016-01-24 DIAGNOSIS — W19XXXA Unspecified fall, initial encounter: Secondary | ICD-10-CM

## 2016-01-24 DIAGNOSIS — Z8673 Personal history of transient ischemic attack (TIA), and cerebral infarction without residual deficits: Secondary | ICD-10-CM

## 2016-01-24 DIAGNOSIS — I63532 Cerebral infarction due to unspecified occlusion or stenosis of left posterior cerebral artery: Principal | ICD-10-CM | POA: Diagnosis present

## 2016-01-24 DIAGNOSIS — Z794 Long term (current) use of insulin: Secondary | ICD-10-CM | POA: Diagnosis not present

## 2016-01-24 DIAGNOSIS — I6789 Other cerebrovascular disease: Secondary | ICD-10-CM | POA: Diagnosis not present

## 2016-01-24 DIAGNOSIS — I6522 Occlusion and stenosis of left carotid artery: Secondary | ICD-10-CM | POA: Diagnosis not present

## 2016-01-24 DIAGNOSIS — F0390 Unspecified dementia without behavioral disturbance: Secondary | ICD-10-CM | POA: Diagnosis not present

## 2016-01-24 DIAGNOSIS — D649 Anemia, unspecified: Secondary | ICD-10-CM | POA: Diagnosis present

## 2016-01-24 DIAGNOSIS — N390 Urinary tract infection, site not specified: Secondary | ICD-10-CM | POA: Diagnosis not present

## 2016-01-24 DIAGNOSIS — Z8249 Family history of ischemic heart disease and other diseases of the circulatory system: Secondary | ICD-10-CM

## 2016-01-24 DIAGNOSIS — S0990XA Unspecified injury of head, initial encounter: Secondary | ICD-10-CM | POA: Diagnosis not present

## 2016-01-24 DIAGNOSIS — Z7982 Long term (current) use of aspirin: Secondary | ICD-10-CM | POA: Diagnosis not present

## 2016-01-24 DIAGNOSIS — I6521 Occlusion and stenosis of right carotid artery: Secondary | ICD-10-CM | POA: Diagnosis not present

## 2016-01-24 DIAGNOSIS — Z9889 Other specified postprocedural states: Secondary | ICD-10-CM

## 2016-01-24 DIAGNOSIS — E119 Type 2 diabetes mellitus without complications: Secondary | ICD-10-CM | POA: Diagnosis not present

## 2016-01-24 DIAGNOSIS — S3993XA Unspecified injury of pelvis, initial encounter: Secondary | ICD-10-CM | POA: Diagnosis not present

## 2016-01-24 DIAGNOSIS — I6502 Occlusion and stenosis of left vertebral artery: Secondary | ICD-10-CM | POA: Diagnosis not present

## 2016-01-24 HISTORY — DX: Type 2 diabetes mellitus without complications: E11.9

## 2016-01-24 HISTORY — DX: Essential (primary) hypertension: I10

## 2016-01-24 HISTORY — DX: Cerebral infarction, unspecified: I63.9

## 2016-01-24 LAB — CBC WITH DIFFERENTIAL/PLATELET
Basophils Absolute: 0 10*3/uL (ref 0.0–0.1)
Basophils Relative: 0 %
EOS PCT: 4 %
Eosinophils Absolute: 0.4 10*3/uL (ref 0.0–0.7)
HCT: 35 % — ABNORMAL LOW (ref 36.0–46.0)
Hemoglobin: 11.3 g/dL — ABNORMAL LOW (ref 12.0–15.0)
LYMPHS ABS: 2.9 10*3/uL (ref 0.7–4.0)
LYMPHS PCT: 33 %
MCH: 29.9 pg (ref 26.0–34.0)
MCHC: 32.3 g/dL (ref 30.0–36.0)
MCV: 92.6 fL (ref 78.0–100.0)
MONO ABS: 1.1 10*3/uL — AB (ref 0.1–1.0)
Monocytes Relative: 12 %
Neutro Abs: 4.6 10*3/uL (ref 1.7–7.7)
Neutrophils Relative %: 51 %
PLATELETS: 265 10*3/uL (ref 150–400)
RBC: 3.78 MIL/uL — AB (ref 3.87–5.11)
RDW: 14.2 % (ref 11.5–15.5)
WBC: 9 10*3/uL (ref 4.0–10.5)

## 2016-01-24 LAB — COMPREHENSIVE METABOLIC PANEL
ALK PHOS: 49 U/L (ref 38–126)
ALT: 21 U/L (ref 14–54)
ANION GAP: 7 (ref 5–15)
AST: 28 U/L (ref 15–41)
Albumin: 3 g/dL — ABNORMAL LOW (ref 3.5–5.0)
BILIRUBIN TOTAL: 0.4 mg/dL (ref 0.3–1.2)
BUN: 31 mg/dL — ABNORMAL HIGH (ref 6–20)
CALCIUM: 9.4 mg/dL (ref 8.9–10.3)
CO2: 24 mmol/L (ref 22–32)
CREATININE: 1.17 mg/dL — AB (ref 0.44–1.00)
Chloride: 103 mmol/L (ref 101–111)
GFR, EST AFRICAN AMERICAN: 49 mL/min — AB (ref >60)
GFR, EST NON AFRICAN AMERICAN: 42 mL/min — AB (ref >60)
Glucose, Bld: 112 mg/dL — ABNORMAL HIGH (ref 65–99)
Potassium: 3.7 mmol/L (ref 3.5–5.1)
Sodium: 134 mmol/L — ABNORMAL LOW (ref 135–145)
Total Protein: 6.2 g/dL — ABNORMAL LOW (ref 6.5–8.1)

## 2016-01-24 LAB — TSH: TSH: 1.56 u[IU]/mL (ref 0.350–4.500)

## 2016-01-24 LAB — GLUCOSE, CAPILLARY
GLUCOSE-CAPILLARY: 69 mg/dL (ref 65–99)
Glucose-Capillary: 128 mg/dL — ABNORMAL HIGH (ref 65–99)

## 2016-01-24 MED ORDER — STROKE: EARLY STAGES OF RECOVERY BOOK
Freq: Once | Status: AC
  Administered 2016-01-24: 21:00:00

## 2016-01-24 MED ORDER — LISINOPRIL 10 MG PO TABS: 10.0000 mg | ORAL_TABLET | Freq: Every day | ORAL | Status: DC

## 2016-01-24 MED ORDER — SODIUM CHLORIDE 0.9 % IV SOLN
INTRAVENOUS | Status: AC
  Administered 2016-01-24: 23:00:00 via INTRAVENOUS

## 2016-01-24 MED ORDER — ASPIRIN 300 MG RE SUPP
300.0000 mg | Freq: Every day | RECTAL | Status: DC
  Administered 2016-01-24: 300 mg via RECTAL
  Filled 2016-01-24: qty 1

## 2016-01-24 MED ORDER — ASPIRIN 325 MG PO TABS
325.0000 mg | ORAL_TABLET | Freq: Every day | ORAL | Status: DC
  Administered 2016-01-25 – 2016-01-29 (×5): 325 mg via ORAL
  Filled 2016-01-24 (×5): qty 1

## 2016-01-24 MED ORDER — FAMOTIDINE 20 MG PO TABS
20.0000 mg | ORAL_TABLET | Freq: Every day | ORAL | Status: DC
  Administered 2016-01-25 – 2016-01-28 (×4): 20 mg via ORAL
  Filled 2016-01-24 (×4): qty 1

## 2016-01-24 MED ORDER — ACETAMINOPHEN 160 MG/5ML PO SOLN: 650.0000 mg | ORAL | Status: DC | PRN

## 2016-01-24 MED ORDER — INSULIN ASPART 100 UNIT/ML ~~LOC~~ SOLN: 0.0000 [IU] | Freq: Three times a day (TID) | SUBCUTANEOUS | Status: DC

## 2016-01-24 MED ORDER — ACETAMINOPHEN 650 MG RE SUPP: 650.0000 mg | RECTAL | Status: DC | PRN

## 2016-01-24 MED ORDER — INSULIN ASPART 100 UNIT/ML ~~LOC~~ SOLN
0.0000 [IU] | SUBCUTANEOUS | Status: DC
  Administered 2016-01-25: 2 [IU] via SUBCUTANEOUS
  Administered 2016-01-25: 3 [IU] via SUBCUTANEOUS
  Administered 2016-01-25: 5 [IU] via SUBCUTANEOUS
  Administered 2016-01-25: 1 [IU] via SUBCUTANEOUS

## 2016-01-24 MED ORDER — ENOXAPARIN SODIUM 40 MG/0.4ML ~~LOC~~ SOLN
40.0000 mg | SUBCUTANEOUS | Status: DC
  Administered 2016-01-24 – 2016-01-28 (×4): 40 mg via SUBCUTANEOUS
  Filled 2016-01-24 (×4): qty 0.4

## 2016-01-24 MED ORDER — AMLODIPINE BESYLATE 5 MG PO TABS: 5.0000 mg | ORAL_TABLET | Freq: Every day | ORAL | Status: DC

## 2016-01-24 MED ORDER — METOPROLOL SUCCINATE ER 100 MG PO TB24: 100.0000 mg | ORAL_TABLET | Freq: Every day | ORAL | Status: DC

## 2016-01-24 MED ORDER — ACETAMINOPHEN 325 MG PO TABS: 650.0000 mg | ORAL_TABLET | ORAL | Status: DC | PRN

## 2016-01-24 MED ORDER — ATORVASTATIN CALCIUM 10 MG PO TABS: 10.0000 mg | ORAL_TABLET | Freq: Every day | ORAL | Status: DC

## 2016-01-24 MED ORDER — GLUCAGON HCL RDNA (DIAGNOSTIC) 1 MG IJ SOLR
INTRAMUSCULAR | Status: AC
  Administered 2016-01-24: 1 mg
  Filled 2016-01-24: qty 1

## 2016-01-24 NOTE — Progress Notes (Addendum)
Patient arrived to unit via [facility] staff, vitals stable. Paged admitting for orders. Admission unable to be completed as patient A&O x2. Continue to monitor patient.

## 2016-01-24 NOTE — Significant Event (Signed)
Hypoglycemic Event  CBG: 69  Treatment: Glucagon IM 1 mg  Symptoms: None  Follow-up CBG: Time: 2322 CBG Result: 128  Possible Reasons for Event: Unknown pt admit from [facility]  Comments/MD notified: Tylene Fantasia, NP made aware, changed orders to q2h CBG x3 occurrences and q4h insulin.   Pt NPO d/t failed swallow screen.     Newman Nickels

## 2016-01-24 NOTE — Consult Note (Signed)
NEURO HOSPITALIST CONSULT NOTE   Requestig physician: Dr. Hal Hope  Reason for Consult: Stroke  History obtained from:   Chart    HPI:                                                                                                                                          Alexandra Bass is an 82 y.o. female who was transferred to [facility] for possible carotid endarterectomy following a stroke. At baseline she lives at home alone next door to her family. She has a recent diagnosis of mild dementia but is able to care for herself. LKN was on 12/3 in the evening. Her family found her on the floor in her bedroom on 12/4 when the went to check on her. They got her up to a chair and noted that she was unable to move the right side of her body. She was brought to the ED at [facility] for further evaluation. A code stroke was initiated. CT of her head showed an acute versus subacute stroke. She was admitted for further workup. At time of initial evaluation at OSH the patient was "somewhat confused" and not following all directions appropriately, but was able to tell the examiner her name and location.   EKG at OSH revealed NSR.   CT at OSH revealed a left PCA distribution hypoattenuating lesion, suggestive of a late acute to subacute ischemic infarction. No hemorrhage was seen.   CTA neck at OSH revealed critical stenosis of the right ICA origin with string sign, with distal apparently normal lumen, as well as severe atherosclerotic stenoses of the bilateral vertebral artery origins. Left ICA origin had calcified plaque but no hemodynamically significant stenosis, with intact flow of the imaged segment of the distal left ICA.   CTA head at OSH revealed severe stenosis of the left supraclinoid ICA with carotid terminus thromboembolism. Fetal origin of the left PCA was also noted.   MRI brain revealed confluent acute infarct throughout the majority of the left PCA territory,  including involvement of the left thalamus and mesial left temporal lobe.   Carotid ultrasound revealed greater than 70% proximal right ICA stenosis, felt likely to be critical by the interpreting physician. Also noted was heavily calcified plaque in left carotid bulb with difficulty detecting flow at this level due to shadowing; left carotid occlusion could not be excluded.   Echocardiogram report at OSH describes LVH with normal systolic function but impaired relaxation. No mural thrombus mentioned in the report.   At OSH, she was continued on Lipitor 10 mg, ASA and other outpatient meds. Plavix was started.    PMHx includes HTN, CHF with EF of 30%, hypercholesterolemia, valvular heart disease, arthritis, and DM2 with peripheral neuropathy.   She has allergies to benadryl  and penicillin.   Home medications listed in OSH records: ASA 81 mg qd, alendronate, atorvastatin 10 mg qd, glimepride, MVI, omeprazole, amlodipine, canagloflozin, insulin, lisinopril, memantine/donepezil combination pill, metoprolol, ranitidine, sitagliptin and tramadol.    Past Medical History:  Diagnosis Date  . Diabetes mellitus without complication ([place])   . Hypertension   . Stroke [facility])     Past Surgical History:  Procedure Laterality Date  . ABDOMINAL HYSTERECTOMY    . CHOLECYSTECTOMY    . EYE SURGERY    . TONSILLECTOMY      Family History  Problem Relation Age of Onset  . Hypertension Brother   . Diabetes Mellitus II Brother     Social History:  reports that she has never smoked. She has never used smokeless tobacco. She reports that she does not drink alcohol or use drugs.  Not on File  MEDICATIONS:                                                                                                                      Current Facility-Administered Medications:  .  0.9 %  sodium chloride infusion, , Intravenous, Continuous, Rise Patience, MD, Last Rate: 75 mL/hr at 01/24/16 2230 .   acetaminophen (TYLENOL) tablet 650 mg, 650 mg, Oral, Q4H PRN **OR** acetaminophen (TYLENOL) solution 650 mg, 650 mg, Per Tube, Q4H PRN **OR** acetaminophen (TYLENOL) suppository 650 mg, 650 mg, Rectal, Q4H PRN, Rise Patience, MD .  amLODipine (NORVASC) tablet 5 mg, 5 mg, Oral, Daily, Rise Patience, MD .  aspirin suppository 300 mg, 300 mg, Rectal, Daily, 300 mg at 01/24/16 2257 **OR** aspirin tablet 325 mg, 325 mg, Oral, Daily, Rise Patience, MD .  atorvastatin (LIPITOR) tablet 10 mg, 10 mg, Oral, q1800, Rise Patience, MD .  enoxaparin (LOVENOX) injection 40 mg, 40 mg, Subcutaneous, Q24H, Rise Patience, MD, 40 mg at 01/24/16 2257 .  famotidine (PEPCID) tablet 20 mg, 20 mg, Oral, QHS, Rise Patience, MD .  insulin aspart (novoLOG) injection 0-9 Units, 0-9 Units, Subcutaneous, Q4H, Karsten Fells Kirby-Graham, NP .  lisinopril (PRINIVIL,ZESTRIL) tablet 10 mg, 10 mg, Oral, Daily, Rise Patience, MD .  metoprolol succinate (TOPROL-XL) 24 hr tablet 100 mg, 100 mg, Oral, Daily, Rise Patience, MD   ROS:  Unable to obtain detailed ROS due to dysphasia. Denies pain.   Blood pressure (!) 127/52, pulse 72, temperature 98.2 F (36.8 C), temperature source Oral, resp. rate 16, weight 60.4 kg (133 lb 2.5 oz), SpO2 97 %.   General Examination:                                                                                                      HEENT-  Normocephalic/atraumatic. Lungs- Respirations unlabored. No gross wheezing.  Extremities- Warm and well perfused.   Neurological Examination Mental Status: Awake. Confused. Poor orientation. Expressive and receptive dysphasia with prominent word-finding difficulty, poor language comprehension, impaired repetition and impaired naming. Able to follow simple one-step commands.  Cranial  Nerves: II: Tracks and fixates upon examiner. No definite blink to threat on left or right. PERRL.  III,IV, VI: ptosis not present, EOMI without nystagmus.  V,VII: smile symmetric, facial temperature sensation normal bilaterally VIII: hearing grossly intact to commands IX,X: mild hypophonia XI: no definite asymmetry XII: midline tongue extension Motor: Right : Upper extremity   4/5    Left:     Upper extremity   5/5  Lower extremity   4/5     Lower extremity   5/5 Sensory: Reacts to stimuli applied to arms and legs bilaterally Deep Tendon Reflexes: 2+ biceps and brachioradialis bilaterally; 2+ patellae, 1+ ankles, downgoing toes.  Cerebellar: Slow FNF bilaterally, without ataxia.  Gait: deferred due to falls risk concerns.   Lab Results: Basic Metabolic Panel: No results for input(s): NA, K, CL, CO2, GLUCOSE, BUN, CREATININE, CALCIUM, MG, PHOS in the last 168 hours.  Liver Function Tests: No results for input(s): AST, ALT, ALKPHOS, BILITOT, PROT, ALBUMIN in the last 168 hours. No results for input(s): LIPASE, AMYLASE in the last 168 hours. No results for input(s): AMMONIA in the last 168 hours.  CBC: No results for input(s): WBC, NEUTROABS, HGB, HCT, MCV, PLT in the last 168 hours.  Cardiac Enzymes: No results for input(s): CKTOTAL, CKMB, CKMBINDEX, TROPONINI in the last 168 hours.  Lipid Panel: No results for input(s): CHOL, TRIG, HDL, CHOLHDL, VLDL, LDLCALC in the last 168 hours.  CBG: No results for input(s): GLUCAP in the last 168 hours.  Microbiology: No results found for this or any previous visit.  Coagulation Studies: No results for input(s): LABPROT, INR in the last 72 hours.  Imaging: No results found.  Impression: 1. Subacute left PCA ischemic infarction. Most likely secondary to transient occlusion of the fetal left PCA seen on CTA.  2. Critical stenosis at right ICA origin. Although this is technically asymptomatic, being contralateral to the affected PCA  territory, if this were to become completely occluded a potentially devastating stroke could result as there is severe stenosis of the left supraclinoid ICA with a carotid terminus thromboembolism as was seen on CTA performed at OSH (compromised left to right collateral flow). Alternatively, endovascular neuroradiology could be consulted to determine if the left carotid terminus can be stented to improve collateral flow.  3. Mild dementia.  Recommendations: 1. Continue ASA and atorvastatin. 2. Restart Plavix.  3. Vascular surgery consult for possible right carotid endarterectomy.  4. Endovascular neuroradiology consult for possible stenting of severely stenotic left carotid terminus.  5. BP management.  6. PT/OT/Speech. 7. May need a cerebral perfusion study. Discuss with endovascular neuroradiology in the morning.   Electronically signed: Dr. Kerney Elbe 01/24/2016, 9:37 PM

## 2016-01-25 DIAGNOSIS — I6521 Occlusion and stenosis of right carotid artery: Secondary | ICD-10-CM

## 2016-01-25 DIAGNOSIS — I6523 Occlusion and stenosis of bilateral carotid arteries: Secondary | ICD-10-CM

## 2016-01-25 DIAGNOSIS — E785 Hyperlipidemia, unspecified: Secondary | ICD-10-CM

## 2016-01-25 DIAGNOSIS — I63532 Cerebral infarction due to unspecified occlusion or stenosis of left posterior cerebral artery: Principal | ICD-10-CM

## 2016-01-25 DIAGNOSIS — E1159 Type 2 diabetes mellitus with other circulatory complications: Secondary | ICD-10-CM

## 2016-01-25 DIAGNOSIS — I10 Essential (primary) hypertension: Secondary | ICD-10-CM

## 2016-01-25 LAB — GLUCOSE, CAPILLARY
GLUCOSE-CAPILLARY: 100 mg/dL — AB (ref 65–99)
GLUCOSE-CAPILLARY: 124 mg/dL — AB (ref 65–99)
GLUCOSE-CAPILLARY: 151 mg/dL — AB (ref 65–99)
Glucose-Capillary: 101 mg/dL — ABNORMAL HIGH (ref 65–99)
Glucose-Capillary: 286 mg/dL — ABNORMAL HIGH (ref 65–99)
Glucose-Capillary: 220 mg/dL — ABNORMAL HIGH (ref 65–99)
Glucose-Capillary: 234 mg/dL — ABNORMAL HIGH (ref 65–99)

## 2016-01-25 LAB — LIPID PANEL
Cholesterol: 236 mg/dL — ABNORMAL HIGH (ref 0–200)
HDL: 34 mg/dL — AB (ref >40)
LDL CALC: 147 mg/dL — AB (ref 0–99)
TRIGLYCERIDES: 277 mg/dL — AB (ref <150)
Total CHOL/HDL Ratio: 6.9 RATIO
VLDL: 55 mg/dL — AB (ref 0–40)

## 2016-01-25 MED ORDER — METOPROLOL SUCCINATE ER 25 MG PO TB24
50.0000 mg | ORAL_TABLET | Freq: Every day | ORAL | Status: DC
  Administered 2016-01-25 – 2016-01-27 (×3): 50 mg via ORAL
  Filled 2016-01-25 (×3): qty 2

## 2016-01-25 MED ORDER — CLOPIDOGREL BISULFATE 75 MG PO TABS
75.0000 mg | ORAL_TABLET | Freq: Every day | ORAL | Status: DC
  Administered 2016-01-26 – 2016-01-29 (×4): 75 mg via ORAL
  Filled 2016-01-25 (×4): qty 1

## 2016-01-25 MED ORDER — CLOPIDOGREL BISULFATE 75 MG PO TABS: 75.0000 mg | ORAL_TABLET | Freq: Every day | ORAL | Status: DC

## 2016-01-25 MED ORDER — INSULIN ASPART 100 UNIT/ML ~~LOC~~ SOLN
0.0000 [IU] | Freq: Three times a day (TID) | SUBCUTANEOUS | Status: DC
  Administered 2016-01-26: 3 [IU] via SUBCUTANEOUS
  Administered 2016-01-26 – 2016-01-27 (×2): 7 [IU] via SUBCUTANEOUS
  Administered 2016-01-27: 9 [IU] via SUBCUTANEOUS
  Administered 2016-01-27: 5 [IU] via SUBCUTANEOUS
  Administered 2016-01-28: 1 [IU] via SUBCUTANEOUS
  Administered 2016-01-28: 2 [IU] via SUBCUTANEOUS
  Administered 2016-01-28 – 2016-01-29 (×2): 3 [IU] via SUBCUTANEOUS
  Administered 2016-01-29: 1 [IU] via SUBCUTANEOUS

## 2016-01-25 MED ORDER — CLOPIDOGREL BISULFATE 75 MG PO TABS
300.0000 mg | ORAL_TABLET | Freq: Once | ORAL | Status: AC
  Administered 2016-01-25: 300 mg via ORAL
  Filled 2016-01-25: qty 4

## 2016-01-25 MED ORDER — ATORVASTATIN CALCIUM 80 MG PO TABS
80.0000 mg | ORAL_TABLET | Freq: Every day | ORAL | Status: DC
  Administered 2016-01-25 – 2016-01-28 (×4): 80 mg via ORAL
  Filled 2016-01-25 (×4): qty 1

## 2016-01-25 NOTE — Care Management Note (Signed)
Case Management Note  Patient Details  Name: SHANIKE COBLEIGH MRN: [id number] Date of Birth: 11/26/1931  Subjective/Objective:        Patient was admitted from [facility] with CVA. CM will follow for discharge needs pending PT/OT evals and physician orders.             Action/Plan:   Expected Discharge Date:                  Expected Discharge Plan:     In-House Referral:     Discharge planning Services     Post Acute Care Choice:    Choice offered to:     DME Arranged:    DME Agency:     HH Arranged:    HH Agency:     Status of Service:     If discussed at [employer] of Stay Meetings, dates discussed:    Additional Comments:  Rolm Baptise, RN 01/25/2016, 10:45 AM

## 2016-01-25 NOTE — Consult Note (Signed)
Consult Note  Patient name: Alexandra Bass MRN: [id number] DOB: 04-Apr-1931 Sex: female  Consulting Physician:  Dr. Allyson Sabal  Reason for Consult: No chief complaint on file.   HISTORY OF PRESENT ILLNESS: This is an 82 year old female who presented to [facility] on 01/21/2016 with right-sided hemiplegia.  The patient was found on the floor of her trailer by her son.  It is unknown how long she was down.  When she got to the hospital she was found to be flaccid in her right arm.  A CT scan was done that showed a acute to subacute left PCA infarct.  This was confirmed with MRI.  She also had a CT angiogram of the neck which showed a high-grade right carotid lesion as well as stenosis within the left supraclinoid internal carotid artery with carotid terminus thromboembolism.  The patient was transferred to [facility].  She was confused.  The patient suffers from diabetes.  She has a history of congestive heart failure.  She is medically managed for hypertension, including taking an ACE inhibitor.  She is on a statin for hypercholesterolemia.  She is a nonsmoker.  Past Medical History:  Diagnosis Date  . Diabetes mellitus without complication ([place])   . Hypertension   . Stroke [facility])     Past Surgical History:  Procedure Laterality Date  . ABDOMINAL HYSTERECTOMY    . CHOLECYSTECTOMY    . EYE SURGERY    . TONSILLECTOMY      Social History   Social History  . Marital status: Widowed    Spouse name: N/A  . Number of children: N/A  . Years of education: N/A   Occupational History  . Not on file.   Social History Main Topics  . Smoking status: Never Smoker  . Smokeless tobacco: Never Used  . Alcohol use No  . Drug use: No  . Sexual activity: Not on file   Other Topics Concern  . Not on file   Social History Narrative  . No narrative on file    Family History  Problem Relation Age of Onset  . Hypertension Brother   . Diabetes Mellitus II Brother      Allergies as of 01/24/2016  . (Not on File)    MEDS PRIOR TO ADMISSION:   alendronate (FOSAMAX) 70 MG tablet  12/20/15   Historical Provider, MD  amLODipine (NORVASC) 5 MG tablet Take 5 mg by mouth daily. 01/16/16   Historical Provider, MD  atorvastatin (LIPITOR) 10 MG tablet Take 10 mg by mouth daily. 12/20/15   Historical Provider, MD  glimepiride (AMARYL) 4 MG tablet Take 4 mg by mouth 2 (two) times daily. 12/20/15   Historical Provider, MD  INVOKANA 300 MG TABS tablet Take 300 mg by mouth daily. 01/15/16   Historical Provider, MD  JANUVIA 100 MG tablet Take 100 mg by mouth daily. 01/15/16   Historical Provider, MD  lisinopril (PRINIVIL,ZESTRIL) 10 MG tablet Take 10 mg by mouth daily. 01/08/16   Historical Provider, MD  metoprolol succinate (TOPROL-XL) 100 MG 24 hr tablet Take 100 mg by mouth daily. 12/20/15   Historical Provider, MD  NAMZARIC 28-10 MG CP24 Take 1 tablet by mouth daily. 01/08/16   Historical Provider, MD  omeprazole (PRILOSEC) 20 MG capsule Take 20 mg by mouth daily. 12/20/15   Historical Provider, MD  ranitidine (ZANTAC) 300 MG tablet Take 300 mg by mouth daily. 12/20/15   Historical Provider, MD  traMADol Veatrice Bourbon)  50 MG tablet Take 50 mg by mouth 2 (two) times daily. 01/08/16   Historical Provider, MD      REVIEW OF SYSTEMS: Cardiovascular: No chest pain, chest pressure, palpitations, orthopnea, or dyspnea on exertion. No claudication or rest pain,  No history of DVT or phlebitis. Pulmonary: No productive cough, asthma or wheezing. Neurologic: See HPI Hematologic: No bleeding problems or clotting disorders. Musculoskeletal: No joint pain or joint swelling. Gastrointestinal: No blood in stool or hematemesis Genitourinary: No dysuria or hematuria. Psychiatric:: No history of major depression. Integumentary: No rashes or ulcers. Constitutional: No fever or chills.  PHYSICAL EXAMINATION: General: The patient appears their stated age.   Vital signs are BP (!) 148/59 (BP Location: Right Arm)   Pulse (!) 58   Temp 98.1 F (36.7 C) (Oral)   Resp 15   Wt 133 lb 2.5 oz (60.4 kg)   SpO2 99%  Pulmonary: Respirations are non-labored HEENT:  No gross abnormalities Abdomen: Soft and non-tender  Musculoskeletal: There are no major deformities.   Neurologic: expressive aphasia.  4/5 right hand grip strength.  Can raise right leg off of bed Skin: There are no ulcer or rashes noted. Psychiatric: The patient has normal affect. Cardiovascular: There is a regular rate and rhythm without significant murmur appreciated.  Diagnostic Studies: I have reviewed her MRI with the following findings:     #1: Confluent acute infarct throughout the majority of the left PCA territory, including involvement of the left thalamus and mesial left temporal lobe.  Cytotoxic edema without associated hemorrhage or mass effect    CT Angio:        #1: Critical stenosis of the right internal carotid artery at its origin       #2: Normal appearance of the left carotid bifurcation       #3: Severe bilateral vertebral artery origin stenosis              #4:  Fetal origin left posterior cerebral artery       #5: Severe left supraclinoid internal carotid artery stenosis with filling defect in the left carotid terminus   Assessment:  Left PCA infarct Plan:  Given that the left extracranial carotid arteries without stenosis, no vascular surgical intervention is warranted at this time.  There is a filling defect within the carotid terminus.  I would recommend evaluation by neuroradiology.  Due to the large size of the infarct, revascularization would carry a high risk of reperfusion injury.  I suspect medical management will be recommended.  The patient does have a high-grade right carotid stenosis.  She does not exhibit right brain symptoms at this time therefore this lesion is asymptomatic.  If she recovers from her left brain event, we could consider elective  right carotid endarterectomy for stroke prevention at a later date.     Eldridge Abrahams, M.D. [facility] Office: [phone] Pager:  [phone]

## 2016-01-25 NOTE — Care Management Note (Signed)
Case Management Note  Patient Details  Name: ALAIJHA DILE MRN: [id number] Date of Birth: 1931/06/26  Subjective/Objective:                    Action/Plan: Pt transferred from [facility]. Family asking about Vascular Consult. CM paged Dr Allyson Sabal about family's concerns. Family also asking for SNF rehab in [place] when patient is medically ready for d/c. Their first choice is [facility]. CM informed CSW. CM following.   Expected Discharge Date:                  Expected Discharge Plan:  [place]  In-House Referral:     Discharge planning Services     Post Acute Care Choice:    Choice offered to:     DME Arranged:    DME Agency:     HH Arranged:    [place] Agency:     Status of Service:  In process, will continue to follow  If discussed at Long Length of Stay Meetings, dates discussed:    Additional Comments:  Pollie Friar, RN 01/25/2016, 4:38 PM

## 2016-01-25 NOTE — Progress Notes (Signed)
Patient's family wanted to talk to MD regarding plan of care. MD paged to [facility]

## 2016-01-25 NOTE — Clinical Social Work Placement (Signed)
   CLINICAL SOCIAL WORK PLACEMENT  NOTE  Date:  01/25/2016  Patient Details  Name: Alexandra Bass MRN: [id number] Date of Birth: 1931-03-01  Clinical Social Work is seeking post-discharge placement for this patient at the [place] level of care (*CSW will initial, date and re-position this form in  chart as items are completed):  Yes   Patient/family provided with [facility] Work Department's list of facilities offering this level of care within the geographic area requested by the patient (or if unable, by the patient's family).  Yes   Patient/family informed of their freedom to choose among providers that offer the needed level of care, that participate in Medicare, Medicaid or managed care program needed by the patient, have an available bed and are willing to accept the patient.  Yes   Patient/family informed of [facility]'s ownership interest in [facility] and [facility], as well as of the fact that they are under no obligation to receive care at these facilities.  PASRR submitted to EDS on       PASRR number received on       Existing PASRR number confirmed on 01/25/16     FL2 transmitted to all facilities in geographic area requested by pt/family on 01/25/16     FL2 transmitted to all facilities within larger geographic area on       Patient informed that his/her managed care company has contracts with or will negotiate with certain facilities, including the following:            Patient/family informed of bed offers received.  Patient chooses bed at       Physician recommends and patient chooses bed at      Patient to be transferred to   on  .  Patient to be transferred to facility by       Patient family notified on   of transfer.  Name of family member notified:        PHYSICIAN       Additional Comment:    _______________________________________________ Darden Dates, LCSW 01/25/2016, 5:54 PM

## 2016-01-25 NOTE — Progress Notes (Addendum)
Patient seen and examined  82 y.o. female with diabetes mellitus type 2, hypertension, CHF, hyperlipidemia and recently diagnosed dementia was brought to [facility] on 01/21/2016 with right-sided hemiplegia. Patient was found on the floor by patient's family and was not moving her right side.MRI of the brain showed confluent left PCA infarct. CT angiogram of the head and neck was done which showed critical stenosis of the right carotids and severe stenosis of the left supraclinoid ICA and carotid terminus thromboembolism  Assessment and plan  1. Left PCA infarct - patient seen by Dr. Cheral Marker on-call neurologist. Carotid ultrasound revealed greater than 70% proximal right ICA stenosis, felt likely to be critical by the interpreting physician. Also noted was heavily calcified plaque in left carotid bulb with difficulty detecting flow at this level due to shadowing; left carotid occlusion could not be excluded.  Discussed with Dr Erlinda Hong, does not need  vascular surgery intervention for the right carotid stenosis. She also has severely stenotic left carotid terminus, Dr Erlinda Hong does not think intervention is needed at this point .Patient is continued on aspirin and Plavix .Lipitor for the stroke. Based on patient request vascular and IR were called to review images and make a decision if intervention would be required  2. Hypertension -  holding off lisinopril , amlodipine and lower dose of  metoprolol. .Need higher BP.Continue with gentle hydration. Closely follow blood pressure trends. 3. Diabetes mellitus type 2 - patient is on sliding scale currently. Closely follow CBGs. Follow hemoglobin A1c. 4. Hyperlipidemia on Lipitor. Lipid panel , LDL 147 5. History of CHF - 2-D echo done last week in [facility] showed LVH with normal systolic function and impaired relaxation. Closely follow respiratory status since patient is receiving IV fluids. 6. Dementia on Namenda and donepezil. 7. Anemia - follow  CBC.

## 2016-01-25 NOTE — Evaluation (Signed)
Physical Therapy Evaluation Patient Details Name: Alexandra Bass MRN: [id number] DOB: [birth year] Today's Date: 01/25/2016   History of Present Illness  82 yo female s/p fall and found down by family. CT (+) L PCA distribution L PCA hypoattenuating lesion CTA ( +) R ICA critical stenosis CTA head ( L supraclinoid iCA with carotid terminus thromboembolism) MRI(+) L PCA involving L thalamus and temporal lobe.  Clinical Impression  Pt admitted with above diagnosis. Pt currently with functional limitations due to the deficits listed below (see PT Problem List). At the time of PT eval pt was able to perform transfers and minimal ambulation with gross +2 mod assist for balance support, RLE support for buckling knee, and truncal assist for R lateral lean in standing. Pt with R sided inattention, but was able to look right or isolate right sided movement with repeated tactile cues. Pt will benefit from skilled PT to increase their independence and safety with mobility to allow discharge to the venue listed below.       Follow Up Recommendations SNF;Supervision/Assistance - 24 hour    Equipment Recommendations  None recommended by PT (TBD by next venue of care)    Recommendations for Other Services       Precautions / Restrictions Precautions Precautions: Fall Precaution Comments: R inattention Restrictions Weight Bearing Restrictions: No      Mobility  Bed Mobility               General bed mobility comments: Pt sitting up in chair upon PT arrival.   Transfers Overall transfer level: Needs assistance Equipment used: 2 person hand held assist Transfers: Sit to/from Stand Sit to Stand: Mod assist;+2 physical assistance         General transfer comment: Verbal and tactile cues for hand placement on seated surface for safety. Pt was able to power-up to full standing position with assist at the gait belt for balance support and boost up.   Ambulation/Gait Ambulation/Gait  assistance: Mod assist;+2 physical assistance Ambulation Distance (Feet): 6 Feet Assistive device: 2 person hand held assist Gait Pattern/deviations: Step-to pattern;Decreased stride length;Trunk flexed Gait velocity: Decreased Gait velocity interpretation: Below normal speed for age/gender General Gait Details: Pt with heavy lean to the R side, with R knee buckling and difficult sequencing. Due to R sided inattention, blocked the LLE from moving as therapists assisted in weight shift to the L for advancement of the R.   Stairs            Wheelchair Mobility    Modified Rankin (Stroke Patients Only) Modified Rankin (Stroke Patients Only) Pre-Morbid Rankin Score: No symptoms Modified Rankin: Severe disability     Balance Overall balance assessment: Needs assistance Sitting-balance support: Feet supported;No upper extremity supported Sitting balance-Leahy Scale: Poor Sitting balance - Comments: Requires UE support to maintain upright sitting posture Postural control: Posterior lean;Right lateral lean Standing balance support: Bilateral upper extremity supported;During functional activity Standing balance-Leahy Scale: Poor                               Pertinent Vitals/Pain Pain Assessment: Faces Faces Pain Scale: No hurt    Home Living Family/patient expects to be discharged to:: Skilled nursing facility     Type of Home: House           Additional Comments: pt lives next door to family and recent dx of mild dementia    Prior Function Level of Independence: Independent  Hand Dominance   Dominant Hand:  (unknown but only using L UE )    Extremity/Trunk Assessment   Upper Extremity Assessment: Defer to OT evaluation           Lower Extremity Assessment: RLE deficits/detail RLE Deficits / Details: Noted R knee buckling. Able to isolate R sided movement with repeated tactile cues    Cervical / Trunk Assessment: Kyphotic   Communication   Communication: Expressive difficulties;Receptive difficulties  Cognition Arousal/Alertness: Awake/alert Behavior During Therapy: Flat affect Overall Cognitive Status: Impaired/Different from baseline Area of Impairment: Orientation;Attention;Following commands;Safety/judgement;Awareness;Problem solving Orientation Level: Disoriented to;Place;Time;Situation;Person Current Attention Level: Focused   Following Commands: Follows one step commands inconsistently;Follows one step commands with increased time Safety/Judgement: Decreased awareness of safety;Decreased awareness of deficits Awareness: Intellectual Problem Solving: Slow processing;Difficulty sequencing General Comments: Pt reports "i dont know" to most questions. pt unable to verbalize DOB. pt able to state "cracker" when attempting to eat graham cracker but unable to verbalize spoon or apple sauce.      General Comments  Noted that pt is at risk for skin breakdown and bottom was red. NT assisted in applying a prophylactic sacral foam at end of session to prevent further skin breakdown.     Exercises General Exercises - Lower Extremity Long Arc Quad: 10 reps;Both;Seated   Assessment/Plan    PT Assessment Patient needs continued PT services  PT Problem List Decreased strength;Decreased activity tolerance;Decreased range of motion;Decreased balance;Decreased mobility;Decreased knowledge of use of DME;Decreased safety awareness;Decreased knowledge of precautions;Decreased coordination;Decreased cognition          PT Treatment Interventions DME instruction;Gait training;Stair training;Functional mobility training;Therapeutic activities;Therapeutic exercise;Neuromuscular re-education;Patient/family education;Cognitive remediation    PT Goals (Current goals can be found in the Care Plan section)  Acute Rehab PT Goals Patient Stated Goal: Pt did not state goals at this time PT Goal Formulation: Patient unable to  participate in goal setting Time For Goal Achievement: 02/08/16 Potential to Achieve Goals: Fair    Frequency Min 3X/week   Barriers to discharge        Co-evaluation PT/OT/SLP Co-Evaluation/Treatment: Yes Reason for Co-Treatment: Complexity of the patient's impairments (multi-system involvement);To address functional/ADL transfers PT goals addressed during session: Mobility/safety with mobility;Balance;Proper use of DME;Strengthening/ROM         End of Session Equipment Utilized During Treatment: Gait belt Activity Tolerance: Patient limited by fatigue Patient left: in chair;with chair alarm set;with call bell/phone within reach Nurse Communication: Mobility status         Time: 1200-1230 PT Time Calculation (min) (ACUTE ONLY): 30 min   Charges:   PT Evaluation $PT Eval Moderate Complexity: 1 Procedure     PT G CodesThelma Comp 13-Feb-2016, 2:02 PM   Rolinda Roan, PT, DPT Acute Rehabilitation Services Pager: [phone]

## 2016-01-25 NOTE — Consult Note (Signed)
[facility] CM Primary Care Navigator  01/25/2016  Alexandra Bass [birth year] [id number]  Met with patient and family--son [name]) and daughter (RN in [place]) at the bedside to identify possible discharge needs. Patient seemed confused but oriented to self (name and birthdate). Son reports that patient was found by him on the floor of her trailer that resulted to this admission. Patient was found to be flaccid in her right arm with right facial droop and loss of vision to right side per son.  Son endorses Dr. Rochel Brome with [facility] as the primary care provider.    Patient shared using [facility] pharmacy to obtain medications without difficulty.   Patient's daughter-in law Lattie Haw) manages her medications at home.   Son provides transportation to her doctors' appointments.  Son and his family were primary caregivers for patient at home.   Per son, discharge plan is skilled nursing facility (still unsure what SNF) for short term rehabilitation. According to daughter, she is planning to move patient to [place] to take care of her after discharge from skilled nursing facility.  Son voiced understanding to call primary care provider's office when she gets home, for a post discharge follow-up appointment within a week or sooner if needs arise. Patient letter provided as a reminder.  Coordinated with patient's RN regarding family's request in finding faxed information sent by primary care provider as requested by attending MD.  Family made aware of received fax information from PCP per RN. Family voiced no further needs or concerns at this time.   For additional questions please contact:  Edwena Felty A. Dilana Mcphie, BSN, RN-BC [facility] PRIMARY CARE Navigator Cell: [phone]

## 2016-01-25 NOTE — Evaluation (Signed)
Occupational Therapy Evaluation Patient Details Name: Alexandra Bass MRN: [id number] DOB: 1931-07-18 Today's Date: 01/25/2016    History of Present Illness 82 yo female s/p fall and found down by family. CT (+) L PCA distribution L PCA hypoattenuating lesion CTA ( +) R ICA critical stenosis CTA head ( L supraclinoid iCA with carotid terminus thromboembolism) MRI(+) L PCA involving L thalamus and temporal lobe.   Clinical Impression   PT admitted with L PCA infarct with thalamus and temporal lobe. Pt currently with functional limitiations due to the deficits listed below (see OT problem list). PTA was independent living alone. Pt will benefit from skilled OT to increase their independence and safety with adls and balance to allow discharge SNF.     Follow Up Recommendations  SNF    Equipment Recommendations  3 in 1 bedside commode;Hospital bed;Wheelchair cushion (measurements OT);Wheelchair (measurements OT)    Recommendations for Other Services       Precautions / Restrictions Precautions Precautions: Fall Precaution Comments: R inattention Restrictions Weight Bearing Restrictions: No      Mobility Bed Mobility Overal bed mobility: Needs Assistance Bed Mobility: Supine to Sit;Rolling Rolling: Mod assist   Supine to sit: Max assist     General bed mobility comments: Pt sitting up in chair upon PT arrival.   Transfers Overall transfer level: Needs assistance Equipment used: 2 person hand held assist Transfers: Sit to/from Stand Sit to Stand: Mod assist;+2 physical assistance Stand pivot transfers: +2 physical assistance;Mod assist       General transfer comment: Verbal and tactile cues for hand placement on seated surface for safety. Pt was able to power-up to full standing position with assist at the gait belt for balance support and boost up.     Balance Overall balance assessment: Needs assistance Sitting-balance support: Feet supported;No upper  extremity supported Sitting balance-Leahy Scale: Poor Sitting balance - Comments: Requires UE support to maintain upright sitting posture Postural control: Posterior lean;Right lateral lean Standing balance support: Bilateral upper extremity supported;During functional activity Standing balance-Leahy Scale: Poor                              ADL Overall ADL's : Needs assistance/impaired                 Upper Body Dressing : Maximal assistance Upper Body Dressing Details (indicate cue type and reason): don new gown due to current gown soiled due to incontinence Lower Body Dressing: Total assistance   Toilet Transfer: +2 for physical assistance;Moderate assistance Toilet Transfer Details (indicate cue type and reason): stand pivot to the R Toileting- Clothing Manipulation and Hygiene: Total assistance Toileting - Clothing Manipulation Details (indicate cue type and reason): total +2 (A) for static standing and total (A) for peri care       General ADL Comments: Pt demonstrates inattention to R UE and R LE. pt moving all extremities but with transfer demonstrates decr activation     Vision     Perception     Praxis      Pertinent Vitals/Pain Pain Assessment: Faces Faces Pain Scale: No hurt     Hand Dominance  (unknown but only using L UE )   Extremity/Trunk Assessment Upper Extremity Assessment Upper Extremity Assessment: Defer to OT evaluation RUE Deficits / Details: decr grasp Brunstrom III with inattention to UE . pt able to move with visual cues to look at R hand RUE Sensation: decreased light  touch RUE Coordination: decreased fine motor;decreased gross motor   Lower Extremity Assessment Lower Extremity Assessment: RLE deficits/detail RLE Deficits / Details: Noted R knee buckling. Able to isolate R sided movement with repeated tactile cues   Cervical / Trunk Assessment Cervical / Trunk Assessment: Kyphotic   Communication  Communication Communication: Expressive difficulties;Receptive difficulties   Cognition Arousal/Alertness: Awake/alert Behavior During Therapy: Flat affect Overall Cognitive Status: Impaired/Different from baseline Area of Impairment: Orientation;Attention;Following commands;Safety/judgement;Awareness;Problem solving Orientation Level: Disoriented to;Place;Time;Situation;Person Current Attention Level: Focused   Following Commands: Follows one step commands inconsistently;Follows one step commands with increased time Safety/Judgement: Decreased awareness of safety;Decreased awareness of deficits Awareness: Intellectual Problem Solving: Slow processing;Difficulty sequencing General Comments: Pt reports "i dont know" to most questions. pt unable to verbalize DOB. pt able to state "cracker" when attempting to eat graham cracker but unable to verbalize spoon or apple sauce.     General Comments       Exercises Exercises: General Lower Extremity     Shoulder Instructions      Home Living Family/patient expects to be discharged to:: Skilled nursing facility     Type of Home: House                           Additional Comments: pt lives next door to family and recent dx of mild dementia  Lives With: Alone    Prior Functioning/Environment Level of Independence: Independent                 OT Problem List: Decreased strength;Decreased activity tolerance;Impaired balance (sitting and/or standing);Decreased safety awareness;Decreased knowledge of use of DME or AE;Decreased knowledge of precautions;Decreased coordination;Decreased cognition;Impaired vision/perception;Decreased range of motion;Impaired UE functional use;Impaired tone;Impaired sensation   OT Treatment/Interventions: Self-care/ADL training;Therapeutic exercise;Neuromuscular education;DME and/or AE instruction;Therapeutic activities;Cognitive remediation/compensation;Visual/perceptual  remediation/compensation;Patient/family education;Balance training    OT Goals(Current goals can be found in the care plan section) Acute Rehab OT Goals Patient Stated Goal: Pt did not state goals at this time OT Goal Formulation: Patient unable to participate in goal setting Time For Goal Achievement: 02/08/16 Potential to Achieve Goals: Good  OT Frequency: Min 3X/week   Barriers to D/C: Decreased caregiver support          Co-evaluation PT/OT/SLP Co-Evaluation/Treatment: Yes Reason for Co-Treatment: Complexity of the patient's impairments (multi-system involvement);To address functional/ADL transfers PT goals addressed during session: Mobility/safety with mobility;Balance;Proper use of DME;Strengthening/ROM        End of Session Equipment Utilized During Treatment: Gait belt Nurse Communication: Mobility status;Precautions  Activity Tolerance: Patient tolerated treatment well Patient left: in chair;with call bell/phone within reach;with chair alarm set   Time: [id number] OT Time Calculation (min): 16 min Charges:  OT General Charges $OT Visit: 1 Procedure OT Evaluation $OT Eval Moderate Complexity: 1 Procedure G-Codes:    Peri Maris 24-Feb-2016, 2:25 PM   Jeri Modena   OTR/L Pager: [phone] Office: [phone] .

## 2016-01-25 NOTE — Progress Notes (Addendum)
STROKE TEAM PROGRESS NOTE   HISTORY OF PRESENT ILLNESS (per record) Alexandra Bass is an 82 y.o. female who was transferred to [facility] for possible carotid endarterectomy following a stroke. At baseline she lives at home alone next door to her family. She has a recent diagnosis of mild dementia but is able to care for herself. LKN was on 12/3 in the evening. Her family found her on the floor in her bedroom on 12/4 when the went to check on her. They got her up to a chair and noted that she was unable to move the right side of her body. She was brought to the ED at [facility] for further evaluation. A code stroke was initiated. CT of her head showed an acute versus subacute stroke. She was admitted for further workup. At time of initial evaluation at OSH the patient was "somewhat confused" and not following all directions appropriately, but was able to tell the examiner her name and location.     SUBJECTIVE (INTERVAL HISTORY) Her PT/OT is at the bedside.  She is sitting in chair with fluid running. PT/OT reported inattention to the right side. Pt confused.    OBJECTIVE Temp:  [97.5 F (36.4 C)-98.5 F (36.9 C)] 98 F (36.7 C) (12/08 1016) Pulse Rate:  [61-85] 67 (12/08 1016) Cardiac Rhythm: Sinus bradycardia (12/08 0838) Resp:  [16-18] 16 (12/08 1016) BP: (119-156)/(35-55) 156/55 (12/08 1016) SpO2:  [96 %-99 %] 99 % (12/08 1016) Weight:  [60.4 kg (133 lb 2.5 oz)] 60.4 kg (133 lb 2.5 oz) (12/07 1925)  CBC:   Recent Labs Lab 01/24/16 2119  WBC 9.0  NEUTROABS 4.6  HGB 11.3*  HCT 35.0*  MCV 92.6  PLT 99991111    Basic Metabolic Panel:   Recent Labs Lab 01/24/16 2119  NA 134*  K 3.7  CL 103  CO2 24  GLUCOSE 112*  BUN 31*  CREATININE 1.17*  CALCIUM 9.4    Lipid Panel:     Component Value Date/Time   CHOL 236 (H) 01/25/2016 0500   TRIG 277 (H) 01/25/2016 0500   HDL 34 (L) 01/25/2016 0500   CHOLHDL 6.9 01/25/2016 0500   VLDL 55 (H) 01/25/2016 0500   LDLCALC 147 (H)  01/25/2016 0500   HgbA1c: No results found for: HGBA1C Urine Drug Screen: No results found for: LABOPIA, COCAINSCRNUR, LABBENZ, AMPHETMU, THCU, LABBARB    IMAGING I have personally reviewed the radiological images below and agree with the radiology interpretations.  Dg Chest Port 1 View 01/25/2016 No active disease.   CT at OSH  Left PCA distribution hypoattenuating lesion, suggestive of a late acute to subacute ischemic infarction. No hemorrhage was seen.   CTA neck at OSH  Critical stenosis of the right ICA origin with string sign, with distal apparently normal lumen, as well as  severe atherosclerotic stenoses of the bilateral vertebral artery origins.  Left ICA origin had calcified plaque but no hemodynamically significant stenosis, with intact flow of the imaged segment of the distal left ICA.   CTA head at OSH  Severe stenosis of the left supraclinoid ICA with carotid terminus thromboembolism.  Fetal origin of the left PCA was also noted.   MRI brain at OSH Confluent acute infarct throughout the majority of the left PCA territory, including involvement of the left thalamus and mesial left temporal lobe.   Carotid ultrasound at OSH Greater than 70% proximal right ICA stenosis, felt likely to be critical by the interpreting physician. Also noted was heavily calcified  plaque in left carotid bulb with difficulty detecting flow at this level due to shadowing; left carotid occlusion could not be excluded.   Echocardiogram at OSH  LVH with normal systolic function but impaired relaxation. No mural thrombus mentioned in the report.   EKG at OSH - NSR.    PHYSICAL EXAM  Temp:  [97.5 F (36.4 C)-98.5 F (36.9 C)] 98 F (36.7 C) (12/08 1016) Pulse Rate:  [61-85] 67 (12/08 1016) Resp:  [16-18] 16 (12/08 1016) BP: (119-156)/(35-55) 156/55 (12/08 1016) SpO2:  [96 %-99 %] 99 % (12/08 1016) Weight:  [60.4 kg (133 lb 2.5 oz)] 60.4 kg (133 lb 2.5 oz) (12/07 1925)  General -  Well nourished, well developed, in no apparent distress.  Ophthalmologic - Fundi not visualized due to noncooperation.  Cardiovascular - Regular rate and rhythm.  Mental Status -  Awake alert, orientated to self only, not orientated to age, place, time or people. Able to follow some simple questions, but not all of them. Paucity of speech, difficult with naming but able to repeat simple sentences.   Cranial Nerves II - XII - II - not blink to visual threat on the right. III, IV, VI - Extraocular movements intact. V - Facial sensation intact bilaterally. VII - Facial movement intact bilaterally. VIII - hard of hearing & vestibular intact bilaterally. X - Palate elevates symmetrically. XI - Chin turning & shoulder shrug intact bilaterally. XII - Tongue protrusion intact.  Motor Strength - The patient's strength was 4/5 LUE, 4/5 LLE, 3/5 RUE and 3/5 RLE and pronator drift was present on the right.  Bulk was normal and fasciculations were absent.   Motor Tone - Muscle tone was assessed at the neck and appendages and was normal.  Reflexes - The patient's reflexes were 1+ in all extremities and she had no pathological reflexes.  Sensory - Light touch, temperature/pinprick were assessed and were symmetrical.    Coordination - not cooperative on exam.  Tremor was absent.  Gait and Station - not tested.   ASSESSMENT/PLAN Ms. Alexandra Bass is a 82 y.o. female with history of mild dementia, previous stroke, and diabetes mellitus presenting after her family found her on the floor in her bedroom on 12/4.  She did not receive IV t-PA due to initially being seen at an outside hospital.  Stroke:  Dominant left PCA large infarct, most likely due to large vessel disease from left ICA proximal and distal atherosclerosis as pt has fetal PCA. Right ICA high grade stenosis at bifurcation and atherosclerosis at siphon are asymptomatic this time.  Resultant  Right hemianopia  MRI - confluent acute  infarct throughout the majority of the left PCA territory  CTA Head - Severe stenosis of the left supraclinoid ICA and fetal PCA stenosis.  CTA Neck - Critical stenosis of the right ICA origin with string sign - severe stenosis of both VA origins. Left ICA proximal nonflow limiting atherosclerosis  2D Echo - OSH - LVH with normal systolic function  LDL - [id number]  HgbA1c pending  VTE prophylaxis - Lovenox Diet Carb Modified Fluid consistency: Thin; Room service appropriate? Yes  aspirin 81 mg daily prior to admission, now on aspirin 325 mg daily and clopidogrel 75 mg daily. Continue DAPT for 3 months and then plavix alone.  Patient counseled to be compliant with her antithrombotic medications  Ongoing aggressive stroke risk factor management  Therapy recommendations:  pending  Disposition: Pending  Intracranial carotid stenosis  Left ICA siphon significant athero with  left fetal PCA stenosis - cause of current stroke  Would need maximized medical treatment first - on DAPT and high dose lipitor  Pt has high risk of intervention due to advance age, right ICA string sign and risk of hemorrhagic transformation  Asymptomatic right ICA proximal high grade stenosis  Daughter request [facility] consultation  Would consider elective surgery in the future  Follow up with [facility] as outpt  Hypertension  Stable  Permissive hypertension (OK if < 220/120) but gradually normalize in 5-7 days  BP goal 130-150  Avoid hypotension  Hyperlipidemia  Home meds:  Lipitor 10 mg daily prior to admission.  LDL 147, goal < 70  Now on Lipitor 80 mg daily.  Continue statin at discharge  Diabetes  HgbA1c pending, goal < 7.0  CBG not in control  Daughter complains of cost of medication and pt is not compliant  Close follow up with PCP for best regimen  SSI  Other Stroke Risk Factors  Advanced age  Hx stroke/TIA  Other Active Problems  Mild dementia  Hospital day # 1  Rosalin Hawking, MD  PhD Stroke Neurology 01/25/2016 9:03 PM   To contact Stroke Continuity provider, please refer to [email or web address] After hours, contact General Neurology

## 2016-01-25 NOTE — NC FL2 (Signed)
[place] MEDICAID FL2 LEVEL OF CARE SCREENING TOOL     IDENTIFICATION  Patient Name: Alexandra Bass Birthdate: 1931/03/28 Sex: female Admission Date (Current Location): 01/24/2016  [facility] and [place] Number:  Herbalist and Address:  [facility] [place], [place] [address], [place], [place] [address]      Provider Number: [id number]  Attending Physician Name and Address:  Reyne Dumas, MD  Relative Name and Phone Number:       Current Level of Care: Hospital Recommended Level of Care: [place] Prior Approval Number:    Date Approved/Denied:   PASRR Number: [id number]  Discharge Plan: SNF    Current Diagnoses: Patient Active Problem List   Diagnosis Date Noted  . Essential hypertension 01/24/2016  . Dementia 01/24/2016  . Type 2 diabetes mellitus with vascular disease ([place]) 01/24/2016  . Acute ischemic left PCA stroke ([place]) 01/24/2016    Orientation RESPIRATION BLADDER Height & Weight     Self  Normal Incontinent Weight: 133 lb 2.5 oz (60.4 kg) Height:     BEHAVIORAL SYMPTOMS/MOOD NEUROLOGICAL BOWEL NUTRITION STATUS      Incontinent Diet (Carb Modified, Thin Liquids)  AMBULATORY STATUS COMMUNICATION OF NEEDS Skin   Extensive Assist Verbally Normal                       Personal Care Assistance Level of Assistance  Bathing, Dressing, Feeding Bathing Assistance: Limited assistance Feeding assistance: Independent Dressing Assistance: Limited assistance     Functional Limitations Info  Sight, Hearing, Speech Sight Info: Adequate Hearing Info: Adequate Speech Info: Adequate    SPECIAL CARE FACTORS FREQUENCY  PT (By licensed PT), OT (By licensed OT), Speech therapy     PT Frequency: 5x/wk OT Frequency: 5x/wk     Speech Therapy Frequency: 5x/wk      Contractures Contractures Info: Not present    Additional Factors Info  Code Status, Allergies Code Status Info: DNR Allergies Info: Codeine,  Diphenhydramine-zinc Acetate, Penicillins           Current Medications (01/25/2016):  This is the current hospital active medication list Current Facility-Administered Medications  Medication Dose Route Frequency Provider Last Rate Last Dose  . 0.9 %  sodium chloride infusion   Intravenous Continuous Rise Patience, MD 75 mL/hr at 01/24/16 2230    . acetaminophen (TYLENOL) tablet 650 mg  650 mg Oral Q4H PRN Rise Patience, MD       Or  . acetaminophen (TYLENOL) solution 650 mg  650 mg Per Tube Q4H PRN Rise Patience, MD       Or  . acetaminophen (TYLENOL) suppository 650 mg  650 mg Rectal Q4H PRN Rise Patience, MD      . aspirin suppository 300 mg  300 mg Rectal Daily Rise Patience, MD   300 mg at 01/24/16 2257   Or  . aspirin tablet 325 mg  325 mg Oral Daily Rise Patience, MD   325 mg at 01/25/16 1032  . atorvastatin (LIPITOR) tablet 80 mg  80 mg Oral q1800 Rosalin Hawking, MD   80 mg at 01/25/16 1718  . [START ON 01/26/2016] clopidogrel (PLAVIX) tablet 75 mg  75 mg Oral Daily Rosalin Hawking, MD      . enoxaparin (LOVENOX) injection 40 mg  40 mg Subcutaneous Q24H Rise Patience, MD   40 mg at 01/24/16 2257  . famotidine (PEPCID) tablet 20 mg  20 mg Oral QHS  Rise Patience, MD      . insulin aspart (novoLOG) injection 0-9 Units  0-9 Units Subcutaneous Q4H Gardiner Barefoot, NP   3 Units at 01/25/16 1718  . metoprolol succinate (TOPROL-XL) 24 hr tablet 50 mg  50 mg Oral Daily Reyne Dumas, MD   50 mg at 01/25/16 1033     Discharge Medications: Please see discharge summary for a list of discharge medications.  Relevant Imaging Results:  Relevant Lab Results:   Additional Information SSN:  [id number]  Darden Dates, LCSW

## 2016-01-25 NOTE — Evaluation (Signed)
Speech Language Pathology Evaluation Patient Details Name: Alexandra Bass MRN: [id number] DOB: 08/20/1931 Today's Date: 01/25/2016 Time: [id number] SLP Time Calculation (min) (ACUTE ONLY): 13 min  Problem List:  Patient Active Problem List   Diagnosis Date Noted  . Essential hypertension 01/24/2016  . Dementia 01/24/2016  . Type 2 diabetes mellitus with vascular disease ([place]) 01/24/2016  . Acute ischemic left PCA stroke ([place]) 01/24/2016   Past Medical History:  Past Medical History:  Diagnosis Date  . Diabetes mellitus without complication ([place])   . Hypertension   . Stroke [facility])    Past Surgical History:  Past Surgical History:  Procedure Laterality Date  . ABDOMINAL HYSTERECTOMY    . CHOLECYSTECTOMY    . EYE SURGERY    . TONSILLECTOMY     HPI:  82 y.o.femalewith diabetes mellitus type 2, hypertension, CHF, hyperlipidemia and recently diagnosed dementia was brought to [facility] on 01/21/2016 with right-sided hemiplegia. MRI showed left PCA infarct.   Assessment / Plan / Recommendation Clinical Impression  Pt has a fluent aphasia that impacts verbal expression more than receptive comprehension. She does need Mod cueing for following one-step commands, and has ~75% accuracy with basic yes/no questions. She has only 25% accuracy with confrontational naming, with responses largely impacted by semantic paraphasias and circumlocution. She appears to have reduced sustained attention as well as impaired awareness, orientation, and basic problem solving. Cognition will need additional assessment as linguistic deficits improve. Recommend acute SLP f/u and CIR level therapy upon d/c given pt's level of independence PTA.    SLP Assessment  Patient needs continued Speech Lanaguage Pathology Services    Follow Up Recommendations  Inpatient Rehab    Frequency and Duration min 2x/week  2 weeks      SLP Evaluation Cognition  Overall Cognitive Status: Impaired/Different from  baseline Arousal/Alertness: Awake/alert Orientation Level: Oriented to person;Disoriented to place Attention: Sustained Sustained Attention: Impaired Sustained Attention Impairment: Functional basic Awareness: Impaired Awareness Impairment: Intellectual impairment;Emergent impairment;Anticipatory impairment Problem Solving: Impaired Problem Solving Impairment: Functional basic Safety/Judgment: Impaired       Comprehension  Auditory Comprehension Overall Auditory Comprehension: Impaired Yes/No Questions: Impaired Basic Biographical Questions: 76-100% accurate Basic Immediate Environment Questions: 75-100% accurate Commands: Impaired One Step Basic Commands: 50-74% accurate Conversation: Simple    Expression Expression Primary Mode of Expression: Verbal Verbal Expression Overall Verbal Expression: Impaired Initiation: No impairment Automatic Speech: Name;Social Response Level of Generative/Spontaneous Verbalization: Phrase Naming: Impairment Confrontation: Impaired Verbal Errors: Semantic paraphasias;Other (comment) (circumlocution)   Oral / Motor  Oral Motor/Sensory Function Overall Oral Motor/Sensory Function: Within functional limits Motor Speech Overall Motor Speech: Appears within functional limits for tasks assessed   GO                    Germain Osgood 01/25/2016, 10:20 AM  Germain Osgood, M.A. CCC-SLP [phone]

## 2016-01-25 NOTE — Clinical Social Work Note (Signed)
Clinical Social Work Assessment  Patient Details  Name: Alexandra Bass MRN: [id number] Date of Birth: 1931/06/13  Date of referral:  01/25/16               Reason for consult:  Facility Placement                Permission sought to share information with:  Family Supports Permission granted to share information::  Yes, Verbal Permission Granted  Name::     Festus Aloe  Relationship::  granddaughter  Contact Information:  [phone]  Housing/Transportation Living arrangements for the past 2 months:  Single Family Home Source of Information:  Adult Children Patient Interpreter Needed:   No Criminal Activity/Legal Involvement Pertinent to Current Situation/Hospitalization:  No - Comment as needed Significant Relationships:  Adult Children, Other Family Members Lives with:   Adult Children Do you feel safe going back to the place where you live?  Yes Need for family participation in patient care:  Yes (Comment)  Care giving concerns:  No care giving concerns identified.   Social Worker assessment / plan:  CSW met with pt and family to address consult. CSW introduced herself and explained role of socila work. Pt was transferred to [facility] from [facility]. Per family, pt had a bed at [facility] [place] prior to transfer. CSW left a message requesting a return phone call with admissions coordinator at [facility]. CSW also initiated SNF search for [facility]. CSW will follow up with bed offers. CSW will continue to follow.   Employment status:  Retired Forensic scientist:  Managed Care PT Recommendations:  [place] / Referral to community resources:  [place]  Patient/Family's Response to care:  Pt's son was Patent attorney of CSW support.   Patient/Family's Understanding of and Emotional Response to Diagnosis, Current Treatment, and Prognosis:  Pt's family understands that pt will need a higher level of care prior to  returning home.   Emotional Assessment Appearance:  Appears stated age Attitude/Demeanor/Rapport:   (Appropriate) Affect (typically observed):  Calm Orientation:  Oriented to Self, Oriented to Place Alcohol / Substance use:  Not Applicable Psych involvement (Current and /or in the community):     Discharge Needs  Concerns to be addressed:  Adjustment to Illness Readmission within the last 30 days:  No Current discharge risk:  Chronically ill Barriers to Discharge:  Continued Medical Work up   Darden Dates, LCSW 01/25/2016, 5:58 PM

## 2016-01-25 NOTE — H&P (Signed)
History and Physical    RICKIE WALLER [id number] DOB: 04-25-1931 DOA: 01/24/2016  PCP: No primary care provider on file.  Patient coming from: [facility].  Patient was seen by me on 01/24/2016 at around 9:30 PM. Case was discussed with the neurologist on call at the same time.  Chief Complaint: Stroke with right-sided hemiplegia.  HPI: LORINE WOLLAN is a 82 y.o. female with diabetes mellitus type 2, hypertension, CHF, hyperlipidemia and recently diagnosed dementia was brought to [facility] on 01/21/2016 with right-sided hemiplegia. Patient was found on the floor by patient's family and was not moving her right side. CT head done without contrast showed late acute to subacute left PCA infarct. MRI of the brain showed confluent left PCA infarct. CT angiogram of the head and neck was done which showed critical stenosis of the right carotids and severe stenosis of the left supraclinoid ICA and carotid terminus thromboembolism. Since patient may need further intervention patient was transferred to [facility]. Patient on exam has weakness of the right upper and lower extremity. Appears confused. Oriented to her name only on my exam. No obvious facial asymmetry. Pupils are reacting to light and tongue is midline.  I have reviewed patient's charts from [facility] including labs and CT scan findings 2-D echo findings and MRI findings.  Labs on 01/23/2070 showing hemoglobin of 12 and creatinine of 0.9.  At the time of transfer patient was on Plavix and aspirin Lipitor memantine and donepezil, amlodipine lisinopril and metoprolol. Patient was on sliding scale for diabetes and patient's oral hypoglycemics and Levemir was on hold.   ED Course: Patient was a direct admit.  Review of Systems: As per HPI, rest all negative.   Past Medical History:  Diagnosis Date  . Diabetes mellitus without complication ([place])   . Hypertension   . Stroke [facility])     Past Surgical  History:  Procedure Laterality Date  . ABDOMINAL HYSTERECTOMY    . CHOLECYSTECTOMY    . EYE SURGERY    . TONSILLECTOMY       reports that she has never smoked. She has never used smokeless tobacco. She reports that she does not drink alcohol or use drugs.  Not on File  Family History  Problem Relation Age of Onset  . Hypertension Brother   . Diabetes Mellitus II Brother     Prior to Admission medications   Medication Sig Start Date End Date Taking? Authorizing Provider  alendronate (FOSAMAX) 70 MG tablet  12/20/15   Historical Provider, MD  amLODipine (NORVASC) 5 MG tablet Take 5 mg by mouth daily. 01/16/16   Historical Provider, MD  atorvastatin (LIPITOR) 10 MG tablet Take 10 mg by mouth daily. 12/20/15   Historical Provider, MD  glimepiride (AMARYL) 4 MG tablet Take 4 mg by mouth 2 (two) times daily. 12/20/15   Historical Provider, MD  INVOKANA 300 MG TABS tablet Take 300 mg by mouth daily. 01/15/16   Historical Provider, MD  JANUVIA 100 MG tablet Take 100 mg by mouth daily. 01/15/16   Historical Provider, MD  lisinopril (PRINIVIL,ZESTRIL) 10 MG tablet Take 10 mg by mouth daily. 01/08/16   Historical Provider, MD  metoprolol succinate (TOPROL-XL) 100 MG 24 hr tablet Take 100 mg by mouth daily. 12/20/15   Historical Provider, MD  NAMZARIC 28-10 MG CP24 Take 1 tablet by mouth daily. 01/08/16   Historical Provider, MD  omeprazole (PRILOSEC) 20 MG capsule Take 20 mg by mouth daily. 12/20/15   Historical Provider,  MD  ranitidine (ZANTAC) 300 MG tablet Take 300 mg by mouth daily. 12/20/15   Historical Provider, MD  traMADol (ULTRAM) 50 MG tablet Take 50 mg by mouth 2 (two) times daily. 01/08/16   Historical Provider, MD    Physical Exam: Vitals:   01/24/16 2325 01/25/16 0125 01/25/16 0325 01/25/16 0525  BP: (!) 119/41 (!) 147/41 (!) 133/54 (!) 125/49  Pulse: 76 66 85 68  Resp: 16 18 16 16   Temp: 98.3 F (36.8 C) 98.1 F (36.7 C) 97.5 F (36.4 C) 98 F (36.7 C)  TempSrc: Oral Oral  Oral Oral  SpO2: 96% 97% 97% 98%  Weight:          Constitutional: Moderately built and nourished. Vitals:   01/24/16 2325 01/25/16 0125 01/25/16 0325 01/25/16 0525  BP: (!) 119/41 (!) 147/41 (!) 133/54 (!) 125/49  Pulse: 76 66 85 68  Resp: 16 18 16 16   Temp: 98.3 F (36.8 C) 98.1 F (36.7 C) 97.5 F (36.4 C) 98 F (36.7 C)  TempSrc: Oral Oral Oral Oral  SpO2: 96% 97% 97% 98%  Weight:       Eyes: Anicteric no pallor. ENMT: No discharge from the ears eyes nose or mouth. Neck: No mass felt. No neck rigidity. Respiratory: No rhonchi or crepitations. Cardiovascular: S1 and S2 heard. No murmurs appreciated. Abdomen: Soft nontender bowel sounds present. No guarding or rigidity. Musculoskeletal: No edema. No joint effusion. Skin: No rash. Skin appears warm. Neurologic: Alert awake oriented to her name. Moves right upper extremity 2 x 5 in right lower extremity 2 x 5. Left upper and lower extremity is 5 x 5. No facial asymmetry. Pupils reacting to light. Psychiatric: Appears confused.   Labs on Admission: I have personally reviewed following labs and imaging studies  CBC:  Recent Labs Lab 01/24/16 2119  WBC 9.0  NEUTROABS 4.6  HGB 11.3*  HCT 35.0*  MCV 92.6  PLT 99991111   Basic Metabolic Panel:  Recent Labs Lab 01/24/16 2119  NA 134*  K 3.7  CL 103  CO2 24  GLUCOSE 112*  BUN 31*  CREATININE 1.17*  CALCIUM 9.4   GFR: CrCl cannot be calculated (Unknown ideal weight.). Liver Function Tests:  Recent Labs Lab 01/24/16 2119  AST 28  ALT 21  ALKPHOS 49  BILITOT 0.4  PROT 6.2*  ALBUMIN 3.0*   No results for input(s): LIPASE, AMYLASE in the last 168 hours. No results for input(s): AMMONIA in the last 168 hours. Coagulation Profile: No results for input(s): INR, PROTIME in the last 168 hours. Cardiac Enzymes: No results for input(s): CKTOTAL, CKMB, CKMBINDEX, TROPONINI in the last 168 hours. BNP (last 3 results) No results for input(s): PROBNP in the last  8760 hours. HbA1C: No results for input(s): HGBA1C in the last 72 hours. CBG:  Recent Labs Lab 01/24/16 2220 01/24/16 2322 01/25/16 0024 01/25/16 0211 01/25/16 0421  GLUCAP 69 128* 151* 100* 124*   Lipid Profile:  Recent Labs  01/25/16 0500  CHOL 236*  HDL 34*  LDLCALC 147*  TRIG 277*  CHOLHDL 6.9   Thyroid Function Tests:  Recent Labs  01/24/16 2119  TSH 1.560   Anemia Panel: No results for input(s): VITAMINB12, FOLATE, FERRITIN, TIBC, IRON, RETICCTPCT in the last 72 hours. Urine analysis: No results found for: COLORURINE, APPEARANCEUR, LABSPEC, PHURINE, GLUCOSEU, HGBUR, BILIRUBINUR, KETONESUR, PROTEINUR, UROBILINOGEN, NITRITE, LEUKOCYTESUR Sepsis Labs: @LABRCNTIP (procalcitonin:4,lacticidven:4) )No results found for this or any previous visit (from the past 240 hour(s)).   Radiological Exams  on Admission: Dg Chest Port 1 View  Result Date: 01/25/2016 CLINICAL DATA:  Hypertension.  Stroke. EXAM: PORTABLE CHEST 1 VIEW COMPARISON:  01/21/2016 FINDINGS: A single AP portable view of the chest demonstrates no focal airspace consolidation or alveolar edema. The lungs are grossly clear. There is no large effusion or pneumothorax. Cardiac and mediastinal contours appear unremarkable. IMPRESSION: No active disease. Electronically Signed   By: Andreas Newport M.D.   On: 01/25/2016 01:08    EKG: Independently reviewed. Normal sinus rhythm with LVH and intra-ventricular conduction delay.  Assessment/Plan Principal Problem:   Acute ischemic left MCA stroke [facility]) Active Problems:   Essential hypertension   Dementia   Type 2 diabetes mellitus with vascular disease ([place])   Stroke (cerebrum) ([place])    1. Left PCA infarct - I have discussed with Dr. Cheral Marker on-call neurologist. Appreciate neurology consult. Please consult neuro interventional radiologist in a.m. for stenosis of the left supraclinoid ICA stenosis and also possible vascular surgery intervention for the right  carotid stenosis. Patient is continued on aspirin and Plavix Lipitor for the stroke. 2. Hypertension - since patient's creatinine has increased from last I am holding off lisinopril but continue amlodipine and metoprolol. Continue with gentle hydration. Closely follow blood pressure trends. 3. Diabetes mellitus type 2 - patient is on sliding scale currently. Closely follow CBGs. Follow hemoglobin A1c. 4. Hyperlipidemia on Lipitor. Lipid panel pending. 5. History of CHF - 2-D echo done last week in [facility] showed LVH with normal systolic function and impaired relaxation. Closely follow respiratory status since patient is receiving IV fluids. 6. Dementia on Namenda and donepezil. 7. Anemia - follow CBC.   DVT prophylaxis: Lovenox. Code Status: DO NOT RESUSCITATE.  Family Communication: No family at the bedside.  Disposition Plan: To be determined.  Consults called: Neurology and physical therapy.  Admission status: Inpatient.    Rise Patience MD [facility] Pager [phone].  If 7PM-7AM, please contact night-coverage [email or web address] Password [email or web address]  01/25/2016, 7:11 AM

## 2016-01-25 NOTE — Evaluation (Signed)
Clinical/Bedside Swallow Evaluation Patient Details  Name: Alexandra Bass MRN: [id number] Date of Birth: 10/14/1931  Today's Date: 01/25/2016 Time: SLP Start Time (ACUTE ONLY): [id number] SLP Stop Time (ACUTE ONLY): 0937 SLP Time Calculation (min) (ACUTE ONLY): 8 min  Past Medical History:  Past Medical History:  Diagnosis Date  . Diabetes mellitus without complication ([place])   . Hypertension   . Stroke [facility])    Past Surgical History:  Past Surgical History:  Procedure Laterality Date  . ABDOMINAL HYSTERECTOMY    . CHOLECYSTECTOMY    . EYE SURGERY    . TONSILLECTOMY     HPI:  82 y.o.femalewith diabetes mellitus type 2, hypertension, CHF, hyperlipidemia and recently diagnosed dementia was brought to [facility] on 01/21/2016 with right-sided hemiplegia. MRI showed left PCA infarct.   Assessment / Plan / Recommendation Clinical Impression  Pt's oropharyngeal swallow is WFL, although she does need occasional assistance from a cognitive-linguistic standpoint for self-feeding. Recommend to initiate a diet of regular textures and thin liquids with close supervision and set-up assistance. No SLP f/u needed for swallowing, but will f/u for cognition.    Aspiration Risk  Mild aspiration risk    Diet Recommendation Regular;Thin liquid   Liquid Administration via: Cup;Straw Medication Administration: Whole meds with liquid Supervision: Patient able to self feed;Full supervision/cueing for compensatory strategies Compensations: Slow rate;Small sips/bites;Minimize environmental distractions Postural Changes: Seated upright at 90 degrees    Other  Recommendations Oral Care Recommendations: Oral care BID   Follow up Recommendations Inpatient Rehab (for cognition/language)      Frequency and Duration            Prognosis        Swallow Study   General HPI: 82 y.o.femalewith diabetes mellitus type 2, hypertension, CHF, hyperlipidemia and recently diagnosed dementia was  brought to [facility] on 01/21/2016 with right-sided hemiplegia. MRI showed left PCA infarct. Type of Study: Bedside Swallow Evaluation Previous Swallow Assessment: none in chart Diet Prior to this Study: NPO Temperature Spikes Noted: No Respiratory Status: Room air History of Recent Intubation: No Behavior/Cognition: Alert;Cooperative;Requires cueing Oral Cavity Assessment: Within Functional Limits Oral Care Completed by SLP: No Oral Cavity - Dentition: Dentures, top;Adequate natural dentition Vision: Functional for self-feeding Self-Feeding Abilities: Able to feed self Patient Positioning: Upright in bed Baseline Vocal Quality: Normal Volitional Cough: Weak Volitional Swallow: Able to elicit    Oral/Motor/Sensory Function Overall Oral Motor/Sensory Function: Within functional limits   Ice Chips Ice chips: Not tested   Thin Liquid Thin Liquid: Within functional limits Presentation: Cup;Self Fed;Straw    Nectar Thick Nectar Thick Liquid: Not tested   Honey Thick Honey Thick Liquid: Not tested   Puree Puree: Within functional limits Presentation: Self Fed;Spoon   Solid   GO   Solid: Within functional limits Presentation: Self [name], [name] 01/25/2016,10:10 AM  Germain Osgood, M.A. CCC-SLP [phone]

## 2016-01-26 DIAGNOSIS — I6523 Occlusion and stenosis of bilateral carotid arteries: Secondary | ICD-10-CM

## 2016-01-26 DIAGNOSIS — F0150 Vascular dementia without behavioral disturbance: Secondary | ICD-10-CM

## 2016-01-26 LAB — GLUCOSE, CAPILLARY
GLUCOSE-CAPILLARY: 458 mg/dL — AB (ref 65–99)
Glucose-Capillary: 231 mg/dL — ABNORMAL HIGH (ref 65–99)
Glucose-Capillary: 301 mg/dL — ABNORMAL HIGH (ref 65–99)
Glucose-Capillary: 258 mg/dL — ABNORMAL HIGH (ref 65–99)

## 2016-01-26 LAB — HEMOGLOBIN A1C
HEMOGLOBIN A1C: 10.1 % — AB (ref 4.8–5.6)
MEAN PLASMA GLUCOSE: 243 mg/dL

## 2016-01-26 LAB — CBC
HEMATOCRIT: 37.1 % (ref 36.0–46.0)
HEMOGLOBIN: 11.8 g/dL — AB (ref 12.0–15.0)
MCH: 29.6 pg (ref 26.0–34.0)
MCHC: 31.8 g/dL (ref 30.0–36.0)
MCV: 93.2 fL (ref 78.0–100.0)
Platelets: 263 10*3/uL (ref 150–400)
RBC: 3.98 MIL/uL (ref 3.87–5.11)
RDW: 14.2 % (ref 11.5–15.5)
WBC: 8.3 10*3/uL (ref 4.0–10.5)

## 2016-01-26 MED ORDER — INSULIN GLARGINE 100 UNIT/ML ~~LOC~~ SOLN
10.0000 [IU] | Freq: Once | SUBCUTANEOUS | Status: AC
  Administered 2016-01-26: 10 [IU] via SUBCUTANEOUS
  Filled 2016-01-26: qty 0.1

## 2016-01-26 MED ORDER — INSULIN ASPART 100 UNIT/ML ~~LOC~~ SOLN
10.0000 [IU] | Freq: Once | SUBCUTANEOUS | Status: AC
  Administered 2016-01-26: 10 [IU] via SUBCUTANEOUS

## 2016-01-26 MED ORDER — NYSTATIN 100000 UNIT/GM EX POWD
Freq: Three times a day (TID) | CUTANEOUS | Status: DC
  Administered 2016-01-26 – 2016-01-29 (×10): via TOPICAL
  Filled 2016-01-26: qty 15

## 2016-01-26 MED ORDER — INSULIN GLARGINE 100 UNIT/ML ~~LOC~~ SOLN
20.0000 [IU] | Freq: Every day | SUBCUTANEOUS | Status: DC
  Administered 2016-01-26: 20 [IU] via SUBCUTANEOUS
  Filled 2016-01-26 (×2): qty 0.2

## 2016-01-26 NOTE — Progress Notes (Signed)
STROKE TEAM PROGRESS NOTE   SUBJECTIVE (INTERVAL HISTORY) No family is at the bedside.  She is lying in bed confused. She seems to have dementia at baseline.   Her daughter is a Marine scientist and she was asking for everything done for pt as pt is going to live with her in [place] in the near future. I had long discussion at bedside with daughter yesterday afternoon, reviewed images with her and discussed with her about pt ICA stenosis b/l and risk and benefit of procedure. I have told her that right ICA high grade stenosis so far asymptomatic and no need urgent surgery and left ICA terminal stenosis needs maximized medical treatment first, and procedure on the left ICA carries high risk due to her advance age, baseline dementia, stable neurological deficit, possible hemorrhagic transformation and general anesthesia. She requested vascular surgery consultation.   VVS did not suggest urgent right CEA but recommend IR cnosultation. Currently pt is waiting to be seen by Alexandra Bass.    OBJECTIVE Temp:  [97.5 F (36.4 C)-98.4 F (36.9 C)] 98.4 F (36.9 C) (12/09 0637) Pulse Rate:  [58-77] 69 (12/09 0637) Cardiac Rhythm: Normal sinus rhythm (12/08 1900) Resp:  [15-18] 16 (12/09 0637) BP: (139-179)/(46-66) 178/53 (12/09 0637) SpO2:  [97 %-100 %] 100 % (12/09 0637)  CBC:   Recent Labs Lab 01/24/16 2119  WBC 9.0  NEUTROABS 4.6  HGB 11.3*  HCT 35.0*  MCV 92.6  PLT 99991111    Basic Metabolic Panel:   Recent Labs Lab 01/24/16 2119  NA 134*  K 3.7  CL 103  CO2 24  GLUCOSE 112*  BUN 31*  CREATININE 1.17*  CALCIUM 9.4    Lipid Panel:     Component Value Date/Time   CHOL 236 (H) 01/25/2016 0500   TRIG 277 (H) 01/25/2016 0500   HDL 34 (L) 01/25/2016 0500   CHOLHDL 6.9 01/25/2016 0500   VLDL 55 (H) 01/25/2016 0500   LDLCALC 147 (H) 01/25/2016 0500   HgbA1c:  Lab Results  Component Value Date   HGBA1C 10.1 (H) 01/25/2016   Urine Drug Screen: No results found for: LABOPIA,  COCAINSCRNUR, LABBENZ, AMPHETMU, THCU, LABBARB    IMAGING I have personally reviewed the radiological images below and agree with the radiology interpretations.  Dg Chest Port 1 View 01/25/2016 No active disease.   CT at OSH  Left PCA distribution hypoattenuating lesion, suggestive of a late acute to subacute ischemic infarction. No hemorrhage was seen.   CTA neck at OSH  Critical stenosis of the right ICA origin with string sign, with distal apparently normal lumen, as well as  severe atherosclerotic stenoses of the bilateral vertebral artery origins.  Left ICA origin had calcified plaque but no hemodynamically significant stenosis, with intact flow of the imaged segment of the distal left ICA.   CTA head at OSH  Severe stenosis of the left supraclinoid ICA with carotid terminus thromboembolism.  Fetal origin of the left PCA was also noted.   MRI brain at OSH Confluent acute infarct throughout the majority of the left PCA territory, including involvement of the left thalamus and mesial left temporal lobe.   Carotid ultrasound at OSH Greater than 70% proximal right ICA stenosis, felt likely to be critical by the interpreting physician. Also noted was heavily calcified plaque in left carotid bulb with difficulty detecting flow at this level due to shadowing; left carotid occlusion could not be excluded.   Echocardiogram at OSH  LVH with normal systolic function but  impaired relaxation. No mural thrombus mentioned in the report.   EKG at OSH - NSR.    PHYSICAL EXAM  Temp:  [97.5 F (36.4 C)-98.4 F (36.9 C)] 98.4 F (36.9 C) (12/09 0637) Pulse Rate:  [58-77] 69 (12/09 0637) Resp:  [15-18] 16 (12/09 0637) BP: (139-179)/(46-66) 178/53 (12/09 0637) SpO2:  [97 %-100 %] 100 % (12/09 0637)  General - Well nourished, well developed, in no apparent distress.  Ophthalmologic - Fundi not visualized due to noncooperation.  Cardiovascular - Regular rate and rhythm.  Mental  Status -  Awake alert, orientated to self only, not orientated to age, place, time or people. Able to follow limited simple questions. Answer questions with "yes" only, difficult with naming but able to repeat simple sentences.   Cranial Nerves II - XII - II - not blink to visual threat on the right. III, IV, VI - Extraocular movements intact. V - Facial sensation intact bilaterally. VII - Facial movement intact bilaterally. VIII - hard of hearing & vestibular intact bilaterally. X - Palate elevates symmetrically. XI - Chin turning & shoulder shrug intact bilaterally. XII - Tongue protrusion intact.  Motor Strength - The patient's strength was 4/5 throughout.  Bulk was normal and fasciculations were absent.   Motor Tone - Muscle tone was assessed at the neck and appendages and was normal.  Reflexes - The patient's reflexes were 1+ in all extremities and she had no pathological reflexes.  Sensory - Light touch, temperature/pinprick were assessed and were symmetrical.    Coordination - not cooperative on exam.  Tremor was absent.  Gait and Station - not tested.   ASSESSMENT/PLAN Ms. Alexandra Bass is a 82 y.o. female with history of mild dementia, previous stroke, and diabetes mellitus presenting after her family found her on the floor in her bedroom on 12/4.  She did not receive IV t-PA due to initially being seen at an outside hospital.  Stroke:  Dominant left PCA large infarct, most likely due to large vessel disease from left ICA proximal and distal atherosclerosis as pt has fetal PCA. Right ICA high grade stenosis at bifurcation and atherosclerosis at siphon are asymptomatic this time. Stroke risk factor DM, HLD  Resultant  Right hemianopia  MRI - confluent acute infarct throughout the majority of the left PCA territory  CTA Head - significant athero at left supraclinoid ICA with fetal PCA stenosis.  CTA Neck - Critical stenosis of the right ICA origin with string sign -  severe stenosis of both VA origins. Left ICA proximal nonflow limiting atherosclerosis  2D Echo - OSH - LVH with normal systolic function  LDL - [id number]  HgbA1c 10.1  VTE prophylaxis - Lovenox Diet Carb Modified Fluid consistency: Thin; Room service appropriate? Yes  aspirin 81 mg daily prior to admission, now on aspirin 325 mg daily and clopidogrel 75 mg daily. Continue DAPT for 3 months and then plavix alone.  Patient counseled to be compliant with her antithrombotic medications  Ongoing aggressive stroke risk factor management  Therapy recommendations:  pending  Disposition: Pending  Intracranial left carotid stenosis  Left ICA siphon significant athero with left fetal PCA stenosis - cause of current stroke  Recommend maximized medical treatment first - on DAPT and high dose lipitor  Against intracranial left ICA manipulation given pt advance age, poorly controlled risk factors, baseline dementia, current stable neuro deficit and risk of PCA hemorrhagic transformation.   Asymptomatic right ICA proximal high grade stenosis  Daughter request VVS consultation  VVS will consider elective surgery in the future  Follow up with VVS as outpt  Hypertension  Stable  Permissive hypertension (OK if < 220/120) but gradually normalize in 5-7 days  BP goal 130-150  Avoid hypotension  Hyperlipidemia  Home meds:  Lipitor 10 mg daily prior to admission.  LDL 147, goal < 70  Now on Lipitor 80 mg daily.  Continue statin at discharge  Diabetes  HgbA1c 10.1, goal < 7.0  CBG not in control  Daughter complains of cost of medication and pt is not compliant  Close follow up with PCP for best regimen  SSI  Other Stroke Risk Factors  Advanced age  Hx stroke/TIA  Other Active Problems  Mild dementia  Hospital day # 2  Neurology will continue to follow up on Monday with cerebral angio.   Alexandra Hawking, MD PhD Stroke Neurology 01/26/2016 1:44 PM    To contact Stroke  Continuity provider, please refer to [email or web address] After hours, contact General Neurology

## 2016-01-26 NOTE — Progress Notes (Signed)
Pt had unwitnessed fall and was found on floor by nursing staff. Pt has no visual signs of injury and does not complain of pain. Pt condition and mentation are consistent with baseline assessment. Dr. Hal Hope was notified at 2330 and order a STAT CT of the head and X- ray of the pelvis. Pt V/S after fall were: BP 159/58; Temp 98.2 oral; Pulse 73; Resp 18; o2 100% on RA. Pt has fall mat placed on floor, as well as high fall risk safety action plan interventions. Family was attempted to contact, will try again in the morning. Will continue to monitor. Fortino Sic, RN, BSN 01/26/2016 11:54 PM

## 2016-01-26 NOTE — Consult Note (Signed)
[name] NOTE   MRN : [id number]  Reason for Consult: carotid stenosis Referring Physician: Dr. Erlinda Hong  History of Present Illness:   Alexandra Bass an 82 y.o.femalewho was transferred to [facility] for possible carotid endarterectomy following a stroke. At baseline she lives at home alone next door to her family. She has a recent diagnosis of mild dementia but is able to care for herself. LKN was on 12/3 in the evening. Her family found her on the floor in her bedroom on 12/4 when the went to check on her. They got her up to a chair and noted that she was unable to move the right side of her body. She was brought to the ED at [facility] for further evaluation. A code stroke was initiated. CT of her head showed an acute versus subacute stroke. She was admitted for further workup. At time of initial evaluation at OSH the patient was "somewhat confused" and not following all directions appropriately, but was able to tell the examiner her name and location.    CTA neck at OSH  Critical stenosis of the right ICA origin with string sign, with distal apparently normal lumen, as well as  severe atherosclerotic stenoses of the bilateral vertebral artery origins.  Left ICA origin had calcified plaque but no hemodynamically significant stenosis, with intact flow of the imaged segment of the distal left ICA.   Carotid ultrasound at OSH Greater than 70% proximal right ICA stenosis, felt likely to be critical by the interpreting physician. Also noted was heavily calcified plaque in left carotid bulb with difficulty detecting flow at this level due to shadowing; left carotid occlusion could not be excluded.  Past medical history: HTN managed with Norvasc and Lisinopril, Hyperlipidemia managed with Lipitor, DM managed with insulin.    Current Facility-Administered Medications  Medication Dose Route Frequency Provider Last Rate Last Dose  . acetaminophen (TYLENOL)  tablet 650 mg  650 mg Oral Q4H PRN Rise Patience, MD       Or  . acetaminophen (TYLENOL) solution 650 mg  650 mg Per Tube Q4H PRN Rise Patience, MD       Or  . acetaminophen (TYLENOL) suppository 650 mg  650 mg Rectal Q4H PRN Rise Patience, MD      . aspirin suppository 300 mg  300 mg Rectal Daily Rise Patience, MD   300 mg at 01/24/16 2257   Or  . aspirin tablet 325 mg  325 mg Oral Daily Rise Patience, MD   325 mg at 01/25/16 1032  . atorvastatin (LIPITOR) tablet 80 mg  80 mg Oral q1800 Rosalin Hawking, MD   80 mg at 01/25/16 1718  . clopidogrel (PLAVIX) tablet 75 mg  75 mg Oral Daily Rosalin Hawking, MD      . enoxaparin (LOVENOX) injection 40 mg  40 mg Subcutaneous Q24H Rise Patience, MD   40 mg at 01/25/16 2245  . famotidine (PEPCID) tablet 20 mg  20 mg Oral QHS Rise Patience, MD   20 mg at 01/25/16 2245  . insulin aspart (novoLOG) injection 0-9 Units  0-9 Units Subcutaneous TID WC Reyne Dumas, MD   3 Units at 01/26/16 0654  . metoprolol succinate (TOPROL-XL) 24 hr tablet 50 mg  50 mg Oral Daily Reyne Dumas, MD   50 mg at 01/25/16 1033    Pt meds include: Statin :Yes Betablocker: No ASA: Yes Other anticoagulants/antiplatelets: plavix ordered this admission  Past  Medical History:  Diagnosis Date  . Diabetes mellitus without complication ([place])   . Hypertension   . Stroke [facility])     Past Surgical History:  Procedure Laterality Date  . ABDOMINAL HYSTERECTOMY    . CHOLECYSTECTOMY    . EYE SURGERY    . TONSILLECTOMY      Social History Social History  Substance Use Topics  . Smoking status: Never Smoker  . Smokeless tobacco: Never Used  . Alcohol use No    Family History Family History  Problem Relation Age of Onset  . Hypertension Brother   . Diabetes Mellitus II Brother     Allergies  Allergen Reactions  . Codeine Other (See Comments)    Unknown  . Diphenhydramine-Zinc Acetate Other (See Comments)    Unknown  . Penicillins  Other (See Comments)    Unknown     REVIEW OF SYSTEMS  General: [ ]  Weight loss, [ ]  Fever, [ ]  chills Neurologic: [ ]  Dizziness, [ ]  Blackouts, [ ]  Seizure [ ]  Stroke, [ ]  "Mini stroke", [ ]  Slurred speech, [ ]  Temporary blindness; [ ]  weakness in arms or legs, [ ]  Hoarseness [ ]  Dysphagia Cardiac: [ ]  Chest pain/pressure, [ ]  Shortness of breath at rest [ ]  Shortness of breath with exertion, [ ]  Atrial fibrillation or irregular heartbeat  Vascular: [ ]  Pain in legs with walking, [ ]  Pain in legs at rest, [ ]  Pain in legs at night,  [ ]  Non-healing ulcer, [ ]  Blood clot in vein/DVT,   Pulmonary: [ ]  Home oxygen, [ ]  Productive cough, [ ]  Coughing up blood, [ ]  Asthma,  [ ]  Wheezing [ ]  COPD Musculoskeletal:  [ ]  Arthritis, [ ]  Low back pain, [ ]  Joint pain Hematologic: [ ]  Easy Bruising, [ ]  Anemia; [ ]  Hepatitis Gastrointestinal: [ ]  Blood in stool, [ ]  Gastroesophageal Reflux/heartburn, Urinary: [ ]  chronic Kidney disease, [ ]  on HD - [ ]  MWF or [ ]  TTHS, [ ]  Burning with urination, [ ]  Difficulty urinating Skin: [ ]  Rashes, [ ]  Wounds Psychological: [ ]  Anxiety, [ ]  Depression  Physical Examination Vitals:   01/25/16 1700 01/25/16 2130 01/26/16 0154 01/26/16 0637  BP: (!) 148/59 (!) 166/46 (!) 179/52 (!) 178/53  Pulse: (!) 58 63 77 69  Resp: 15 18 18 16   Temp: 98.1 F (36.7 C) 98.2 F (36.8 C) 97.5 F (36.4 C) 98.4 F (36.9 C)  TempSrc: Oral Oral Oral Oral  SpO2: 99% 97% 100% 100%  Weight:       There is no height or weight on file to calculate BMI.  General:  WDWN in NAD  HENT: WNL Eyes: Pupils equal, identifies no. Of fingers held up vision intact Pulmonary: normal non-labored breathing , without Rales, rhonchi,  wheezing Cardiac: RRR, without  Murmurs, rubs or gallops; No carotid bruits Abdomen: soft, NT, no masses Skin: no rashes, ulcers noted;  no Gangrene , no cellulitis; no open wounds;   Vascular Exam/Pulses:Palpable radial, popliteal, DP/PT     Musculoskeletal: no muscle wasting or atrophy; no edema  Neurologic: Alert; seems confused, unsure of place and what happened to bring her to the hospital  SENSATION: normal; MOTOR FUNCTION: moving all 4 extremities Speech is fluent/normal   Significant Diagnostic Studies: CBC Lab Results  Component Value Date   WBC 9.0 01/24/2016   HGB 11.3 (L) 01/24/2016   HCT 35.0 (L) 01/24/2016   MCV 92.6 01/24/2016   PLT 265 01/24/2016  BMET    Component Value Date/Time   NA 134 (L) 01/24/2016 2119   K 3.7 01/24/2016 2119   CL 103 01/24/2016 2119   CO2 24 01/24/2016 2119   GLUCOSE 112 (H) 01/24/2016 2119   BUN 31 (H) 01/24/2016 2119   CREATININE 1.17 (H) 01/24/2016 2119   CALCIUM 9.4 01/24/2016 2119   GFRNONAA 42 (L) 01/24/2016 2119   GFRAA 49 (L) 01/24/2016 2119   CrCl cannot be calculated (Unknown ideal weight.).  COAG No results found for: INR, PROTIME   Non-Invasive Vascular Imaging:    CTA neck at OSH  Critical stenosis of the right ICA origin with string sign, with distal apparently normal lumen, as well as  severe atherosclerotic stenoses of the bilateral vertebral artery origins.  Left ICA origin had calcified plaque but no hemodynamically significant stenosis, with intact flow of the imaged segment of the distal left ICA.   Carotid ultrasound at OSH Greater than 70% proximal right ICA stenosis, felt likely to be critical by the interpreting physician. Also noted was heavily calcified plaque in left carotid bulb with difficulty detecting flow at this level due to shadowing; left carotid occlusion could not be excluded.  severe stenosis of both VA origins  ASSESSMENT/PLAN:  Right Carotid stenosis CTA : right ICA string sign Left ICA origin had calcified plaque but no hemodynamically significant stenosis, with intact flow of the imaged segment of the distal left ICA.  1. Left PCA infarct  recently diagnosed dementia was brought to [facility] on  01/21/2016 with right-sided hemiplegia.  Hemiplegia seems to be resolving.    severe stenosis of both VA origins  She has had a left brain stroke without left ICA stenosis.  She has significant stenosis of the right ICA and sever stenosis of the vertebral arteries.  Pending Dr. Trula Slade review of the CTA and chart she may need right CEA in the future.   Laurence Slate [facility] 01/26/2016 8:46 AM   Agree with the above.  Please see my consult note

## 2016-01-26 NOTE — Progress Notes (Addendum)
[facility] PROGRESS NOTE  Alexandra Bass [id number] DOB: 1931/04/10 DOA: 01/24/2016   PCP: No primary care provider on file.     Assessment/Plan: Principal Problem:   Acute ischemic left PCA stroke (HCC) Active Problems:   Essential hypertension   Dementia   Type 2 diabetes mellitus with vascular disease (HCC)   Hyperlipidemia   Bilateral carotid artery stenosis   82 y.o.femalewho was transferred to [facility] for possible carotid endarterectomy following a stroke. At baseline she lives at home alone next door to her family. She has a recent diagnosis of mild dementia but is able to care for herself. LKN was on 12/3 in the evening. Her family found her on the floor in her bedroom on 12/4 when the went to check on her. They got her up to a chair and noted that she was unable to move the right side of her body. She was brought to the ED at [facility] for further evaluation. A code stroke was initiated. CT of her head showed an acute versus subacute stroke.     Assessment and plan left PCA large infarct Right ICA high grade stenosis at bifurcation and atherosclerosis at siphon are asymptomatic this time.  MRI - confluent acute infarct throughout the majority of the left PCA territory  CTA Head - Severe stenosis of the left supraclinoid ICA and   PCA stenosis.  CTA Neck - Critical stenosis of the right ICA origin with string sign - severe stenosis of both VA origins. Left ICA proximal nonflow limiting atherosclerosis  2D Echo - OSH - LVH with normal systolic function  LDL - Q000111Q  HgbA1c 10.1  VTE prophylaxis - Lovenox  Diet Carb Modified Fluid consistency: Thin; Room service appropriate? Yes  aspirin 81 mg daily prior to admission, now on aspirin 325 mg daily and clopidogrel 75 mg daily. Continue DAPT for 3 months and then plavix alone  Discussed with vascular surgery Dr Trula Slade: he does not feel that right ICA needs urgent intervention, does recommend IR  consult for left supraclinoid internal carotid artery with carotid terminus thromboembolism, this has been requested , Dr Estanislado Pandy has been contacted .Will have catheter angiogram Monday   Hypertension-  holding off lisinopril , amlodipine and lower dose of  metoprolol. .Need higher BP.Continue with gentle hydration. Closely follow blood pressure trends.  Diabetes mellitus type 2- patient is on sliding scale currently. Closely follow CBGs.   patient currently takes Amaryl, tresiba , invokana, januvia , A1c is still not controlled. Will request diabetes coordinator consult and transition over to insulin, Lantus and NovoLog   Hypewill hemoglobin A1c 10.1ipitor. Lipid panel , LDL 147. Continue statin    History of CHF- 2-D echo done last week in [facility] showed LVH with normal systolic function and impaired relaxation. Closely follow respiratory status since patient is receiving IV fluids.  Dementiaon Namenda and donepezil.   Anemia- follow CBC.   DVT prophylaxsis  Lovenox  Code Status:   DO NOT RESUSCITATE    Family Communication: Discussed in detail with the patient, all imaging results, lab results explained to the patient   Disposition Plan:  1-2 days     Consultants:   Neurology  Vascular surgery  IR   procedures  None    Antibiotics: Anti-infectives    None         HPI/Subjective: Patient sitting and eating breakfast , speech is garbled   Objective: Vitals:   01/25/16 1700 01/25/16 2130 01/26/16  0154 01/26/16 0637  BP: (!) 148/59 (!) 166/46 (!) 179/52 (!) 178/53  Pulse: (!) 58 63 77 69  Resp: 15 18 18 16   Temp: 98.1 F (36.7 C) 98.2 F (36.8 C) 97.5 F (36.4 C) 98.4 F (36.9 C)  TempSrc: Oral Oral Oral Oral  SpO2: 99% 97% 100% 100%  Weight:       No intake or output data in the 24 hours ending 01/26/16 0851  Exam:  Examination:  General exam: Appears calm and comfortable  Respiratory system: Clear to auscultation.  Respiratory effort normal. Cardiovascular system: S1 & S2 heard, RRR. No JVD, murmurs, rubs, gallops or clicks. No pedal edema. Gastrointestinal system: Abdomen is nondistended, soft and nontender. No organomegaly or masses felt. Normal bowel sounds heard. Central nervous system: Alert and oriented. No focal neurological deficits. Motor Strength - The patient's strength was 4/5 LUE, 4/5 LLE, 3/5 RUE and 3/5 RLE and pronator drift was present on the right.  Bulk was normal and fasciculations were absent Skin: No rashes, lesions or ulcers Psychiatry: Judgement and insight appear normal. Mood & affect appropriate.     Data Reviewed: I have personally reviewed following labs and imaging studies  Micro Results No results found for this or any previous visit (from the past 240 hour(s)).  Radiology Reports Dg Chest Port 1 View  Result Date: 01/25/2016 CLINICAL DATA:  Hypertension.  Stroke. EXAM: PORTABLE CHEST 1 VIEW COMPARISON:  01/21/2016 FINDINGS: A single AP portable view of the chest demonstrates no focal airspace consolidation or alveolar edema. The lungs are grossly clear. There is no large effusion or pneumothorax. Cardiac and mediastinal contours appear unremarkable. IMPRESSION: No active disease. Electronically Signed   By: Andreas Newport M.D.   On: 01/25/2016 01:08     CBC  Recent Labs Lab 01/24/16 2119  WBC 9.0  HGB 11.3*  HCT 35.0*  PLT 265  MCV 92.6  MCH 29.9  MCHC 32.3  RDW 14.2  LYMPHSABS 2.9  MONOABS 1.1*  EOSABS 0.4  BASOSABS 0.0    Chemistries   Recent Labs Lab 01/24/16 2119  NA 134*  K 3.7  CL 103  CO2 24  GLUCOSE 112*  BUN 31*  CREATININE 1.17*  CALCIUM 9.4  AST 28  ALT 21  ALKPHOS 49  BILITOT 0.4   ------------------------------------------------------------------------------------------------------------------ CrCl cannot be calculated (Unknown ideal  weight.). ------------------------------------------------------------------------------------------------------------------  Recent Labs  01/25/16 0500  HGBA1C 10.1*   ------------------------------------------------------------------------------------------------------------------  Recent Labs  01/25/16 0500  CHOL 236*  HDL 34*  LDLCALC 147*  TRIG 277*  CHOLHDL 6.9   ------------------------------------------------------------------------------------------------------------------  Recent Labs  01/24/16 2119  TSH 1.560   ------------------------------------------------------------------------------------------------------------------ No results for input(s): VITAMINB12, FOLATE, FERRITIN, TIBC, IRON, RETICCTPCT in the last 72 hours.  Coagulation profile No results for input(s): INR, PROTIME in the last 168 hours.  No results for input(s): DDIMER in the last 72 hours.  Cardiac Enzymes No results for input(s): CKMB, TROPONINI, MYOGLOBIN in the last 168 hours.  Invalid input(s): CK ------------------------------------------------------------------------------------------------------------------ Invalid input(s): POCBNP   CBG:  Recent Labs Lab 01/25/16 0827 01/25/16 1311 01/25/16 1627 01/25/16 1959 01/26/16 0653  GLUCAP 101* 286* 220* 234* 231*       Studies: Dg Chest Port 1 View  Result Date: 01/25/2016 CLINICAL DATA:  Hypertension.  Stroke. EXAM: PORTABLE CHEST 1 VIEW COMPARISON:  01/21/2016 FINDINGS: A single AP portable view of the chest demonstrates no focal airspace consolidation or alveolar edema. The lungs are grossly clear. There is no large effusion or pneumothorax.  Cardiac and mediastinal contours appear unremarkable. IMPRESSION: No active disease. Electronically Signed   By: Andreas Newport M.D.   On: 01/25/2016 01:08      Lab Results  Component Value Date   HGBA1C 10.1 (H) 01/25/2016   Lab Results  Component Value Date   LDLCALC 147 (H)  01/25/2016   CREATININE 1.17 (H) 01/24/2016       Scheduled Meds: . aspirin  300 mg Rectal Daily   Or  . aspirin  325 mg Oral Daily  . atorvastatin  80 mg Oral q1800  . clopidogrel  75 mg Oral Daily  . enoxaparin (LOVENOX) injection  40 mg Subcutaneous Q24H  . famotidine  20 mg Oral QHS  . insulin aspart  0-9 Units Subcutaneous TID WC  . metoprolol succinate  50 mg Oral Daily  . nystatin   Topical TID   Continuous Infusions:   LOS: 2 days    Time spent: >30 MINS    [facility]  [facility] Pager [phone]. If 7PM-7AM, please contact night-coverage at [email or web address], password [facility] 01/26/2016, 8:51 AM  LOS: 2 days

## 2016-01-27 ENCOUNTER — Inpatient Hospital Stay (HOSPITAL_COMMUNITY): Payer: Commercial Managed Care - HMO

## 2016-01-27 LAB — COMPREHENSIVE METABOLIC PANEL
ALK PHOS: 58 U/L (ref 38–126)
ALT: 27 U/L (ref 14–54)
ANION GAP: 11 (ref 5–15)
AST: 28 U/L (ref 15–41)
Albumin: 3.2 g/dL — ABNORMAL LOW (ref 3.5–5.0)
BILIRUBIN TOTAL: 0.3 mg/dL (ref 0.3–1.2)
BUN: 30 mg/dL — AB (ref 6–20)
CALCIUM: 9.5 mg/dL (ref 8.9–10.3)
CO2: 23 mmol/L (ref 22–32)
Chloride: 101 mmol/L (ref 101–111)
Creatinine, Ser: 0.9 mg/dL (ref 0.44–1.00)
GFR calc Af Amer: >60 mL/min (ref >60)
GFR, EST NON AFRICAN AMERICAN: 58 mL/min — AB (ref >60)
GLUCOSE: 276 mg/dL — AB (ref 65–99)
POTASSIUM: 4 mmol/L (ref 3.5–5.1)
Sodium: 135 mmol/L (ref 135–145)
TOTAL PROTEIN: 6.8 g/dL (ref 6.5–8.1)

## 2016-01-27 LAB — CBC
HEMATOCRIT: 37 % (ref 36.0–46.0)
HEMOGLOBIN: 12.2 g/dL (ref 12.0–15.0)
MCH: 30.4 pg (ref 26.0–34.0)
MCHC: 33 g/dL (ref 30.0–36.0)
MCV: 92.3 fL (ref 78.0–100.0)
Platelets: 267 10*3/uL (ref 150–400)
RBC: 4.01 MIL/uL (ref 3.87–5.11)
RDW: 14.1 % (ref 11.5–15.5)
WBC: 9.2 10*3/uL (ref 4.0–10.5)

## 2016-01-27 LAB — URINALYSIS, ROUTINE W REFLEX MICROSCOPIC
BILIRUBIN URINE: NEGATIVE
Glucose, UA: >=500 mg/dL — AB
KETONES UR: NEGATIVE mg/dL
Nitrite: NEGATIVE
PH: 5 (ref 5.0–8.0)
PROTEIN: NEGATIVE mg/dL
Specific Gravity, Urine: 1.021 (ref 1.005–1.030)

## 2016-01-27 LAB — GLUCOSE, CAPILLARY
GLUCOSE-CAPILLARY: 257 mg/dL — AB (ref 65–99)
GLUCOSE-CAPILLARY: 356 mg/dL — AB (ref 65–99)
Glucose-Capillary: 347 mg/dL — ABNORMAL HIGH (ref 65–99)
Glucose-Capillary: 244 mg/dL — ABNORMAL HIGH (ref 65–99)

## 2016-01-27 MED ORDER — INSULIN GLARGINE 100 UNIT/ML ~~LOC~~ SOLN
35.0000 [IU] | Freq: Every day | SUBCUTANEOUS | Status: DC
  Administered 2016-01-27: 35 [IU] via SUBCUTANEOUS
  Filled 2016-01-27 (×2): qty 0.35

## 2016-01-27 MED ORDER — SODIUM CHLORIDE 0.9 % IV SOLN
INTRAVENOUS | Status: DC
  Administered 2016-01-27: 17:00:00 via INTRAVENOUS

## 2016-01-27 NOTE — Progress Notes (Addendum)
Patient scheduled for follow in the office in 1 month for further discussions regarding right carotid stenosis Will sign off Please call with further questions  Annamarie Major [phone]

## 2016-01-27 NOTE — Progress Notes (Signed)
[facility] PROGRESS NOTE  RONIYAH BRESEE [id number] DOB: 03/17/1931 DOA: 01/24/2016   PCP: No primary care provider on file.     Assessment/Plan: Principal Problem:   Acute ischemic left PCA stroke (HCC) Active Problems:   Essential hypertension   Dementia   Type 2 diabetes mellitus with vascular disease (HCC)   Hyperlipidemia   Bilateral carotid artery stenosis   82 y.o.femalewho was transferred to [facility] for possible carotid endarterectomy following a stroke. At baseline she lives at home alone next door to her family. She has a recent diagnosis of mild dementia but is able to care for herself. LKN was on 12/3 in the evening. Her family found her on the floor in her bedroom on 12/4 when the went to check on her. They got her up to a chair and noted that she was unable to move the right side of her body. She was brought to the ED at [facility] for further evaluation. A code stroke was initiated. CT of her head showed an acute versus subacute stroke.     Assessment and plan left PCA large infarct Right ICA high grade stenosis at bifurcation and atherosclerosis at siphon are asymptomatic this time.  MRI - confluent acute infarct throughout the majority of the left PCA territory  CTA Head - Severe stenosis of the left supraclinoid ICA and   PCA stenosis.  CTA Neck - Critical stenosis of the right ICA origin with string sign - severe stenosis of both VA origins. Left ICA proximal nonflow limiting atherosclerosis  2D Echo - OSH - LVH with normal systolic function  LDL - Q000111Q  HgbA1c 10.1  VTE prophylaxis - Lovenox  Diet Carb Modified Fluid consistency: Thin; Room service appropriate? Yes  aspirin 81 mg daily prior to admission, now on aspirin 325 mg daily and clopidogrel 75 mg daily. Continue DAPT for 3 months and then plavix alone  Discussed with vascular surgery Dr Trula Slade: he does not feel that right ICA needs urgent intervention, does recommend IR  consult for left supraclinoid internal carotid artery with carotid terminus thromboembolism, this has been requested , Dr Estanislado Pandy has been contacted .Will have catheter angiogram Monday.holding Lovenox. ASA/Plavix ok  Neurology Against intracranial left ICA manipulation given pt advance age, poorly controlled risk factors, baseline dementia. This was conveyed to interventional radiology   Hypertension-  holding off lisinopril , amlodipine and lower dose of  metoprolol. .Need higher BP.Continue with gentle hydration. Closely follow blood pressure trends.  Diabetes mellitus type 2- concern for dietary noncompliance.   patient currently takes Amaryl, tresiba , invokana, januvia , A1c is still not controlled. Discussed with daughter, she is agreeable to transition over to Lantus and NovoLog   H[place]will hemoglobin A1c 10.1ipitor. Lipid panel , LDL 147. Continue statin    History of CHF- 2-D echo done last week in [facility] showed LVH with normal systolic function and impaired relaxation.    Dementiaon Namenda and donepezil.   Anemia- follow CBC.  Fall-patient fell last night, found on the floor, vital signs were stable, pelvic x-ray and CT scan within normal limits   DVT prophylaxsis  Lovenox  Code Status:   DO NOT RESUSCITATE    Family Communication: Discussed in detail with the patient/daughter, all imaging results, lab results explained to the patient   Disposition Plan:  Catheter angiogram tomorrow     Consultants:   Neurology  Vascular surgery  IR   procedures  None  Antibiotics: Anti-infectives    None         HPI/Subjective: Patient sitting and eating breakfast , speech is garbled   Objective: Vitals:   01/26/16 2157 01/26/16 2318 01/27/16 0138 01/27/16 0538  BP: (!) 155/59 (!) 159/56 (!) 161/55 (!) 151/52  Pulse: 78 73 69 77  Resp: 18 18 18 18   Temp: 98.7 F (37.1 C) 98.2 F (36.8 C) 98.2 F (36.8 C) 98.3 F (36.8 C)  TempSrc:  Oral Oral Oral Oral  SpO2: 97% 100% 97% 97%  Weight:       No intake or output data in the 24 hours ending 01/27/16 0858  Exam:  Examination:  General exam: Appears calm and comfortable  Respiratory system: Clear to auscultation. Respiratory effort normal. Cardiovascular system: S1 & S2 heard, RRR. No JVD, murmurs, rubs, gallops or clicks. No pedal edema. Gastrointestinal system: Abdomen is nondistended, soft and nontender. No organomegaly or masses felt. Normal bowel sounds heard. Central nervous system: Alert and oriented. No focal neurological deficits. Motor Strength - The patient's strength was 4/5 LUE, 4/5 LLE, 3/5 RUE and 3/5 RLE and pronator drift was present on the right.  Bulk was normal and fasciculations were absent Skin: No rashes, lesions or ulcers Psychiatry: Judgement and insight appear normal. Mood & affect appropriate.     Data Reviewed: I have personally reviewed following labs and imaging studies  Micro Results No results found for this or any previous visit (from the past 240 hour(s)).  Radiology Reports Dg Pelvis 1-2 Views  Result Date: 01/27/2016 CLINICAL DATA:  Fall today. EXAM: PELVIS - 1-2 VIEW COMPARISON:  None. FINDINGS: The cortical margins of the bony pelvis are intact. No fracture. Pubic symphysis and sacroiliac joints are congruent. Both femoral heads are well-seated in the respective acetabula. There is barium within colonic diverticula and rectosigmoid colon from prior barium swallow. Vascular calcifications are seen. IMPRESSION: No evidence of pelvic fracture. Electronically Signed   By: Jeb Levering M.D.   On: 01/27/2016 02:28   Ct Head Wo Contrast  Result Date: 01/27/2016 CLINICAL DATA:  Unwitnessed fall.  Found on floor.  Recent stroke. EXAM: CT HEAD WITHOUT CONTRAST TECHNIQUE: Contiguous axial images were obtained from the base of the skull through the vertex without intravenous contrast. COMPARISON:  Most recent head CT 01/22/2016  FINDINGS: Brain: Expected evolution of left PCA distribution infarct in the temporal occipital lobe from prior exam with associated mild sulcal effacement. No hemorrhagic transformation. No midline shift. No new ischemia. No hemorrhage elsewhere. No subdural or extra-axial fluid collection. Stable atrophy and chronic small vessel ischemia. Vascular: No hyperdense vessel. Carotid terminus thromboembolism on prior CTA is not seen on noncontrast exam. There is skullbase atherosclerosis. Skull: Normal. Negative for fracture or focal lesion. Sinuses/Orbits: Scattered mucosal thickening of the ethmoid air cells. No sinus fluid levels. No mastoid air cell opacification. Other: None. 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 No hemorrhage transformation. No new abnormality is seen. Electronically Signed   By: Jeb Levering M.D.   On: 01/27/2016 00:41   Dg Chest Port 1 View  Result Date: 01/25/2016 CLINICAL DATA:  Hypertension.  Stroke. EXAM: PORTABLE CHEST 1 VIEW COMPARISON:  01/21/2016 FINDINGS: A single AP portable view of the chest demonstrates no focal airspace consolidation or alveolar edema. The lungs are grossly clear. There is no large effusion or pneumothorax. Cardiac and mediastinal contours appear unremarkable. IMPRESSION: No active disease. Electronically Signed   By: Andreas Newport M.D.   On: 01/25/2016 01:08  CBC  Recent Labs Lab 01/24/16 2119 01/26/16 1001 01/27/16 0329  WBC 9.0 8.3 9.2  HGB 11.3* 11.8* 12.2  HCT 35.0* 37.1 37.0  PLT 265 263 267  MCV 92.6 93.2 92.3  MCH 29.9 29.6 30.4  MCHC 32.3 31.8 33.0  RDW 14.2 14.2 14.1  LYMPHSABS 2.9  --   --   MONOABS 1.1*  --   --   EOSABS 0.4  --   --   BASOSABS 0.0  --   --     Chemistries   Recent Labs Lab 01/24/16 2119 01/27/16 0329  NA 134* 135  K 3.7 4.0  CL 103 101  CO2 24 23  GLUCOSE 112* 276*  BUN 31* 30*  CREATININE 1.17* 0.90  CALCIUM 9.4 9.5  AST 28 28  ALT 21 27  ALKPHOS 49 58   BILITOT 0.4 0.3   ------------------------------------------------------------------------------------------------------------------ CrCl cannot be calculated (Unknown ideal weight.). ------------------------------------------------------------------------------------------------------------------  Recent Labs  01/25/16 0500  HGBA1C 10.1*   ------------------------------------------------------------------------------------------------------------------  Recent Labs  01/25/16 0500  CHOL 236*  HDL 34*  LDLCALC 147*  TRIG 277*  CHOLHDL 6.9   ------------------------------------------------------------------------------------------------------------------  Recent Labs  01/24/16 2119  TSH 1.560   ------------------------------------------------------------------------------------------------------------------ No results for input(s): VITAMINB12, FOLATE, FERRITIN, TIBC, IRON, RETICCTPCT in the last 72 hours.  Coagulation profile No results for input(s): INR, PROTIME in the last 168 hours.  No results for input(s): DDIMER in the last 72 hours.  Cardiac Enzymes No results for input(s): CKMB, TROPONINI, MYOGLOBIN in the last 168 hours.  Invalid input(s): CK ------------------------------------------------------------------------------------------------------------------ Invalid input(s): POCBNP   CBG:  Recent Labs Lab 01/26/16 0653 01/26/16 1103 01/26/16 1613 01/26/16 2200 01/27/16 0617  GLUCAP 231* 301* 458* 258* 257*       Studies: Dg Pelvis 1-2 Views  Result Date: 01/27/2016 CLINICAL DATA:  Fall today. EXAM: PELVIS - 1-2 VIEW COMPARISON:  None. FINDINGS: The cortical margins of the bony pelvis are intact. No fracture. Pubic symphysis and sacroiliac joints are congruent. Both femoral heads are well-seated in the respective acetabula. There is barium within colonic diverticula and rectosigmoid colon from prior barium swallow. Vascular calcifications are  seen. IMPRESSION: No evidence of pelvic fracture. Electronically Signed   By: Jeb Levering M.D.   On: 01/27/2016 02:28   Ct Head Wo Contrast  Result Date: 01/27/2016 CLINICAL DATA:  Unwitnessed fall.  Found on floor.  Recent stroke. EXAM: CT HEAD WITHOUT CONTRAST TECHNIQUE: Contiguous axial images were obtained from the base of the skull through the vertex without intravenous contrast. COMPARISON:  Most recent head CT 01/22/2016 FINDINGS: Brain: Expected evolution of left PCA distribution infarct in the temporal occipital lobe from prior exam with associated mild sulcal effacement. No hemorrhagic transformation. No midline shift. No new ischemia. No hemorrhage elsewhere. No subdural or extra-axial fluid collection. Stable atrophy and chronic small vessel ischemia. Vascular: No hyperdense vessel. Carotid terminus thromboembolism on prior CTA is not seen on noncontrast exam. There is skullbase atherosclerosis. Skull: Normal. Negative for fracture or focal lesion. Sinuses/Orbits: Scattered mucosal thickening of the ethmoid air cells. No sinus fluid levels. No mastoid air cell opacification. Other: None. 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 No hemorrhage transformation. No new abnormality is seen. Electronically Signed   By: Jeb Levering M.D.   On: 01/27/2016 00:41      Lab Results  Component Value Date   HGBA1C 10.1 (H) 01/25/2016   Lab Results  Component Value Date   LDLCALC 147 (H) 01/25/2016   CREATININE  0.90 01/27/2016       Scheduled Meds: . aspirin  300 mg Rectal Daily   Or  . aspirin  325 mg Oral Daily  . atorvastatin  80 mg Oral q1800  . clopidogrel  75 mg Oral Daily  . enoxaparin (LOVENOX) injection  40 mg Subcutaneous Q24H  . famotidine  20 mg Oral QHS  . insulin aspart  0-9 Units Subcutaneous TID WC  . insulin glargine  35 Units Subcutaneous Daily  . metoprolol succinate  50 mg Oral Daily  . nystatin   Topical TID   Continuous Infusions:    LOS: 3 days    Time spent: >30 MINS    [facility]  [facility] Pager [phone]. If 7PM-7AM, please contact night-coverage at [email or web address], password [facility] 01/27/2016, 8:58 AM  LOS: 3 days

## 2016-01-27 NOTE — Progress Notes (Signed)
Chief Complaint: Patient was seen in consultation today for CVA and carotid stenosis at the request of Dr. Rosalin Hawking  Referring Physician(s): Dr. Rosalin Hawking  Supervising Physician: Luanne Bras  Patient Status: [facility] - In-pt  History of Present Illness: Alexandra Bass is a 82 y.o. female who has suffered a left PCA infarct. Her workup finds severe (R)ICA stensois, bilateral vert art stenosis and an occlusive left intracranial ICA. She was seen by [facility] who does not recommend treatment of the (R)ICA at this time but suggested Neuro IR eval of the left ICA terminus stenosis/occlusion. Dr. Estanislado Pandy is aware of this case and recommendations. Pt is currently on Plavix and ASA She is seen this am, no family present at this time. PMHx, meds, labs, imagine reviewed.  Past Medical History:  Diagnosis Date  . Diabetes mellitus without complication ([place])   . Hypertension   . Stroke [facility])     Past Surgical History:  Procedure Laterality Date  . ABDOMINAL HYSTERECTOMY    . CHOLECYSTECTOMY    . EYE SURGERY    . TONSILLECTOMY      Allergies: Codeine; Diphenhydramine-zinc acetate; and Penicillins  Medications:  Current Facility-Administered Medications:  .  0.9 %  sodium chloride infusion, , Intravenous, Continuous, Reyne Dumas, MD .  acetaminophen (TYLENOL) tablet 650 mg, 650 mg, Oral, Q4H PRN **OR** acetaminophen (TYLENOL) solution 650 mg, 650 mg, Per Tube, Q4H PRN **OR** acetaminophen (TYLENOL) suppository 650 mg, 650 mg, Rectal, Q4H PRN, Rise Patience, MD .  aspirin suppository 300 mg, 300 mg, Rectal, Daily, 300 mg at 01/24/16 2257 **OR** aspirin tablet 325 mg, 325 mg, Oral, Daily, Rise Patience, MD, 325 mg at 01/27/16 0905 .  atorvastatin (LIPITOR) tablet 80 mg, 80 mg, Oral, q1800, Rosalin Hawking, MD, 80 mg at 01/26/16 1707 .  clopidogrel (PLAVIX) tablet 75 mg, 75 mg, Oral, Daily, Rosalin Hawking, MD, 75 mg at 01/27/16 0905 .  enoxaparin (LOVENOX) injection 40 mg, 40  mg, Subcutaneous, Q24H, Rise Patience, MD, 40 mg at 01/26/16 2028 .  famotidine (PEPCID) tablet 20 mg, 20 mg, Oral, QHS, Rise Patience, MD, 20 mg at 01/26/16 2028 .  insulin aspart (novoLOG) injection 0-9 Units, 0-9 Units, Subcutaneous, TID WC, Reyne Dumas, MD, 5 Units at 01/27/16 0808 .  insulin glargine (LANTUS) injection 35 Units, 35 Units, Subcutaneous, Daily, Reyne Dumas, MD .  metoprolol succinate (TOPROL-XL) 24 hr tablet 50 mg, 50 mg, Oral, Daily, Reyne Dumas, MD, 50 mg at 01/27/16 0905 .  nystatin (MYCOSTATIN/NYSTOP) topical powder, , Topical, TID, Reyne Dumas, MD    Family History  Problem Relation Age of Onset  . Hypertension Brother   . Diabetes Mellitus II Brother     Social History   Social History  . Marital status: Widowed    Spouse name: N/A  . Number of children: N/A  . Years of education: N/A   Social History Main Topics  . Smoking status: Never Smoker  . Smokeless tobacco: Never Used  . Alcohol use No  . Drug use: No  . Sexual activity: Not Asked   Other Topics Concern  . None   Social History Narrative  . None     Review of Systems: A 12 point ROS discussed and pertinent positives are indicated in the HPI above.  All other systems are negative.  Review of Systems  Vital Signs: BP (!) 127/48 (BP Location: Right Arm)   Pulse 88   Temp 98.5 F (36.9 C) (Oral)  Resp 18   Wt 133 lb 2.5 oz (60.4 kg)   SpO2 100%   Physical Exam  Constitutional: She appears well-developed. No distress.  Pt siting up in chair, just ate breakfast   HENT:  Head: Normocephalic.  Mouth/Throat: Oropharynx is clear and moist.  Neck: Normal range of motion. No JVD present. No tracheal deviation present.  Cardiovascular: Normal rate, regular rhythm, normal heart sounds and intact distal pulses.   Pulmonary/Chest: Effort normal and breath sounds normal. No respiratory distress.  Neurological:  Pt awake but not alert. Does answer 'yes' when asked if her  name is 'Alexandra Bass'  Skin: Skin is warm and dry.     Mallampati Score:  MD Evaluation Airway: WNL Heart: WNL Abdomen: WNL Chest/ Lungs: WNL ASA  Classification: 3 Mallampati/Airway Score: Two  Imaging: Dg Pelvis 1-2 Views  Result Date: 01/27/2016 CLINICAL DATA:  Fall today. EXAM: PELVIS - 1-2 VIEW COMPARISON:  None. FINDINGS: The cortical margins of the bony pelvis are intact. No fracture. Pubic symphysis and sacroiliac joints are congruent. Both femoral heads are well-seated in the respective acetabula. There is barium within colonic diverticula and rectosigmoid colon from prior barium swallow. Vascular calcifications are seen. IMPRESSION: No evidence of pelvic fracture. Electronically Signed   By: Jeb Levering M.D.   On: 01/27/2016 02:28   Ct Head Wo Contrast  Result Date: 01/27/2016 CLINICAL DATA:  Unwitnessed fall.  Found on floor.  Recent stroke. EXAM: CT HEAD WITHOUT CONTRAST TECHNIQUE: Contiguous axial images were obtained from the base of the skull through the vertex without intravenous contrast. COMPARISON:  Most recent head CT 01/22/2016 FINDINGS: Brain: Expected evolution of left PCA distribution infarct in the temporal occipital lobe from prior exam with associated mild sulcal effacement. No hemorrhagic transformation. No midline shift. No new ischemia. No hemorrhage elsewhere. No subdural or extra-axial fluid collection. Stable atrophy and chronic small vessel ischemia. Vascular: No hyperdense vessel. Carotid terminus thromboembolism on prior CTA is not seen on noncontrast exam. There is skullbase atherosclerosis. Skull: Normal. Negative for fracture or focal lesion. Sinuses/Orbits: Scattered mucosal thickening of the ethmoid air cells. No sinus fluid levels. No mastoid air cell opacification. Other: None. 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 No hemorrhage transformation. No new abnormality is seen. Electronically Signed   By: Jeb Levering M.D.   On:  01/27/2016 00:41   Dg Chest Port 1 View  Result Date: 01/25/2016 CLINICAL DATA:  Hypertension.  Stroke. EXAM: PORTABLE CHEST 1 VIEW COMPARISON:  01/21/2016 FINDINGS: A single AP portable view of the chest demonstrates no focal airspace consolidation or alveolar edema. The lungs are grossly clear. There is no large effusion or pneumothorax. Cardiac and mediastinal contours appear unremarkable. IMPRESSION: No active disease. Electronically Signed   By: Andreas Newport M.D.   On: 01/25/2016 01:08    Labs:  CBC:  Recent Labs  01/24/16 2119 01/26/16 1001 01/27/16 0329  WBC 9.0 8.3 9.2  HGB 11.3* 11.8* 12.2  HCT 35.0* 37.1 37.0  PLT 265 263 267    COAGS: No results for input(s): INR, APTT in the last 8760 hours.  BMP:  Recent Labs  01/24/16 2119 01/27/16 0329  NA 134* 135  K 3.7 4.0  CL 103 101  CO2 24 23  GLUCOSE 112* 276*  BUN 31* 30*  CALCIUM 9.4 9.5  CREATININE 1.17* 0.90  GFRNONAA 42* 58*  GFRAA 49* >60    LIVER FUNCTION TESTS:  Recent Labs  01/24/16 2119 01/27/16 0329  BILITOT 0.4 0.3  AST 28 28  ALT 21 27  ALKPHOS 49 58  PROT 6.2* 6.8  ALBUMIN 3.0* 3.2*    TUMOR MARKERS: No results for input(s): AFPTM, CEA, CA199, CHROMGRNA in the last 8760 hours.  Assessment and Plan: (L)PCA infarct Critical (R)ICA stenosis, no intervention plan per [facility]. Bilateral vert stenosis Critical stenosis vs occlusion of (L)ICA supraclinoid/terminus. Plan for dx angio tomorrow, made NPO p MN and holding Lovenox. ASA/Plavix ok No family present, will discuss with them tomorrow prior to procedure and obtain consent.  Thank you for this interesting consult.   A copy of this report was sent to the requesting provider on this date.  Electronically Signed: Ascencion Dike 01/27/2016, 9:14 AM   I spent a total of 20 minutes in face to face in clinical consultation, greater than 50% of which was counseling/coordinating care for cerebral angiogram

## 2016-01-28 ENCOUNTER — Other Ambulatory Visit: Payer: Self-pay | Admitting: *Deleted

## 2016-01-28 ENCOUNTER — Inpatient Hospital Stay (HOSPITAL_COMMUNITY): Payer: Commercial Managed Care - HMO

## 2016-01-28 DIAGNOSIS — I6523 Occlusion and stenosis of bilateral carotid arteries: Secondary | ICD-10-CM

## 2016-01-28 HISTORY — PX: IR GENERIC HISTORICAL: IMG1180011

## 2016-01-28 LAB — PROTIME-INR
INR: 0.98
PROTHROMBIN TIME: 13 s (ref 11.4–15.2)

## 2016-01-28 LAB — GLUCOSE, CAPILLARY
GLUCOSE-CAPILLARY: 162 mg/dL — AB (ref 65–99)
GLUCOSE-CAPILLARY: 98 mg/dL (ref 65–99)
Glucose-Capillary: 213 mg/dL — ABNORMAL HIGH (ref 65–99)
Glucose-Capillary: 228 mg/dL — ABNORMAL HIGH (ref 65–99)
Glucose-Capillary: 182 mg/dL — ABNORMAL HIGH (ref 65–99)
Glucose-Capillary: 135 mg/dL — ABNORMAL HIGH (ref 65–99)

## 2016-01-28 MED ORDER — LIDOCAINE HCL 1 % IJ SOLN
INTRAMUSCULAR | Status: AC
  Filled 2016-01-28: qty 20

## 2016-01-28 MED ORDER — LIDOCAINE HCL 1 % IJ SOLN
INTRAMUSCULAR | Status: DC | PRN
  Administered 2016-01-28: 10 mL

## 2016-01-28 MED ORDER — FENTANYL CITRATE (PF) 100 MCG/2ML IJ SOLN
INTRAMUSCULAR | Status: AC
  Filled 2016-01-28: qty 2

## 2016-01-28 MED ORDER — INSULIN GLARGINE 100 UNIT/ML ~~LOC~~ SOLN
40.0000 [IU] | Freq: Every day | SUBCUTANEOUS | Status: DC
  Administered 2016-01-28 – 2016-01-29 (×2): 40 [IU] via SUBCUTANEOUS
  Filled 2016-01-28 (×2): qty 0.4

## 2016-01-28 MED ORDER — MIDAZOLAM HCL 2 MG/2ML IJ SOLN
INTRAMUSCULAR | Status: AC
  Filled 2016-01-28: qty 2

## 2016-01-28 MED ORDER — HEPARIN SOD (PORK) LOCK FLUSH 100 UNIT/ML IV SOLN
INTRAVENOUS | Status: AC
  Filled 2016-01-28: qty 10

## 2016-01-28 MED ORDER — HEPARIN SODIUM (PORCINE) 1000 UNIT/ML IJ SOLN
INTRAMUSCULAR | Status: DC | PRN
  Administered 2016-01-28: 1000 [IU] via INTRAVENOUS

## 2016-01-28 MED ORDER — MIDAZOLAM HCL 2 MG/2ML IJ SOLN
INTRAMUSCULAR | Status: DC | PRN
  Administered 2016-01-28: 1 mg via INTRAVENOUS

## 2016-01-28 MED ORDER — IOPAMIDOL (ISOVUE-300) INJECTION 61%
INTRAVENOUS | Status: AC
  Administered 2016-01-28: 90 mL
  Filled 2016-01-28: qty 150

## 2016-01-28 MED ORDER — FENTANYL CITRATE (PF) 100 MCG/2ML IJ SOLN
INTRAMUSCULAR | Status: DC | PRN
  Administered 2016-01-28: 25 ug via INTRAVENOUS

## 2016-01-28 NOTE — Progress Notes (Signed)
Pt arrived back to unit with dressing to right groin. Dry and intact. Family at bedside. Safety measures in place. Call bell within reach. Will continue to monitor.

## 2016-01-28 NOTE — Clinical Social Work Note (Signed)
CSW met with pt and son at pt's son request. CSW presented only one current bed offers. However, CSW shared that she will contact [facility], pt's first choice, to inquire about a potential bed offer. CSW will continue to follow.   Darden Dates, MSW, LCSW  Clinical Social Worker [phone]

## 2016-01-28 NOTE — Sedation Documentation (Signed)
Patient is resting comfortably. 

## 2016-01-28 NOTE — Care Management Important Message (Signed)
Important Message  Patient Details  Name: Alexandra Bass MRN: [id number] Date of Birth: Sep 19, 1931   Medicare Important Message Given:  Yes    [name] Montine Circle 01/28/2016, 11:31 AM

## 2016-01-28 NOTE — Progress Notes (Signed)
STROKE TEAM PROGRESS NOTE   SUBJECTIVE (INTERVAL HISTORY) Patient up in gerichair. For cerebral angio today. No new complaints. Dr. Leonie Bass confirmed angio with IR staff.   OBJECTIVE Temp:  [97.7 F (36.5 C)-99.1 F (37.3 C)] 97.7 F (36.5 C) (12/11 0857) Pulse Rate:  [60-87] 69 (12/11 0857) Cardiac Rhythm: Normal sinus rhythm;Bundle branch block (12/11 0732) Resp:  [16-20] 16 (12/11 0857) BP: (132-160)/(45-57) 154/50 (12/11 0857) SpO2:  [96 %-99 %] 99 % (12/11 0857)  CBC:   Recent Labs Lab 01/24/16 2119 01/26/16 1001 01/27/16 0329  WBC 9.0 8.3 9.2  NEUTROABS 4.6  --   --   HGB 11.3* 11.8* 12.2  HCT 35.0* 37.1 37.0  MCV 92.6 93.2 92.3  PLT 265 263 [id number]    Basic Metabolic Panel:   Recent Labs Lab 01/24/16 2119 01/27/16 0329  NA 134* 135  K 3.7 4.0  CL 103 101  CO2 24 23  GLUCOSE 112* 276*  BUN 31* 30*  CREATININE 1.17* 0.90  CALCIUM 9.4 9.5    Lipid Panel:     Component Value Date/Time   CHOL 236 (H) 01/25/2016 0500   TRIG 277 (H) 01/25/2016 0500   HDL 34 (L) 01/25/2016 0500   CHOLHDL 6.9 01/25/2016 0500   VLDL 55 (H) 01/25/2016 0500   LDLCALC 147 (H) 01/25/2016 0500   HgbA1c:  Lab Results  Component Value Date   HGBA1C 10.1 (H) 01/25/2016    IMAGING  Dg Chest Port 1 View 01/25/2016 No active disease.   CT at OSH  Left PCA distribution hypoattenuating lesion, suggestive of a late acute to subacute ischemic infarction. No hemorrhage was seen.   CTA neck at OSH  Critical stenosis of the right ICA origin with string sign, with distal apparently normal lumen, as well as  severe atherosclerotic stenoses of the bilateral vertebral artery origins.  Left ICA origin had calcified plaque but no hemodynamically significant stenosis, with intact flow of the imaged segment of the distal left ICA.   CTA head at OSH  Severe stenosis of the left supraclinoid ICA with carotid terminus thromboembolism.  Fetal origin of the left PCA was also noted.   MRI  brain at OSH Confluent acute infarct throughout the majority of the left PCA territory, including involvement of the left thalamus and mesial left temporal lobe.   Carotid ultrasound at OSH Greater than 70% proximal right ICA stenosis, felt likely to be critical by the interpreting physician. Also noted was heavily calcified plaque in left carotid bulb with difficulty detecting flow at this level due to shadowing; left carotid occlusion could not be excluded.   Echocardiogram at OSH  LVH with normal systolic function but impaired relaxation. No mural thrombus mentioned in the report.   EKG at OSH - NSR.   Cerebral angio pending    PHYSICAL EXAM  General - Well nourished, well developed, in no apparent distress.  Ophthalmologic - Fundi not visualized due to noncooperation.  Cardiovascular - Regular rate and rhythm.  Mental Status -  Awake alert, orientated to self only, not orientated to age, place, time or people. Able to follow limited simple questions. Answer questions with "yes" only, difficult with naming but able to repeat simple sentences.   Cranial Nerves II - XII - II - not blink to visual threat on the right. III, IV, VI - Extraocular movements intact. V - Facial sensation intact bilaterally. VII - Facial movement intact bilaterally. VIII - hard of hearing & vestibular intact bilaterally. X - Palate  elevates symmetrically. XI - Chin turning & shoulder shrug intact bilaterally. XII - Tongue protrusion intact.  Motor Strength - The patient's strength was 4/5 throughout.  Bulk was normal and fasciculations were absent.   Motor Tone - Muscle tone was assessed at the neck and appendages and was normal.  Reflexes - The patient's reflexes were 1+ in all extremities and she had no pathological reflexes.  Sensory - Light touch, temperature/pinprick were assessed and were symmetrical.    Coordination - not cooperative on exam.  Tremor was absent.  Gait and Station - not  tested.   ASSESSMENT/PLAN Ms. Alexandra Bass is a 82 y.o. female with history of mild dementia, previous stroke, and diabetes mellitus presenting after her family found her on the floor in her bedroom on 12/4.  She did not receive IV t-PA due to initially being seen at an outside hospital.  Stroke:  Dominant left PCA large infarct, most likely due to large vessel disease from left ICA proximal and distal atherosclerosis as pt has fetal PCA. Right ICA high grade stenosis at bifurcation and atherosclerosis at siphon are asymptomatic this time. Stroke risk factor DM, HLD  Resultant  Right hemianopia  MRI - confluent acute infarct throughout the majority of the left PCA territory  CTA Head - significant athero at left supraclinoid ICA with fetal PCA stenosis.  CTA Neck - Critical stenosis of the right ICA origin with string sign - severe stenosis of both VA origins. Left ICA proximal nonflow limiting atherosclerosis  2D Echo - OSH - LVH with normal systolic function  Cerebral angio scheduled for today  LDL - 147  HgbA1c 10.1  VTE prophylaxis - Lovenox Diet NPO time specified  aspirin 81 mg daily prior to admission, now on aspirin 325 mg daily and clopidogrel 75 mg daily. Continue DAPT for 3 months and then plavix alone.  Patient counseled to be compliant with her antithrombotic medications  Ongoing aggressive stroke risk factor management  Therapy recommendations:  pending   Disposition: pending   Intracranial left carotid stenosis  Left ICA siphon significant athero with left fetal PCA stenosis - cause of current stroke  Recommend maximized medical treatment first - on DAPT and high dose lipitor  Against intracranial left ICA manipulation given pt advance age, poorly controlled risk factors, baseline dementia, current stable neuro deficit and risk of PCA hemorrhagic transformation.   Cerebral angio scheduled for today  Asymptomatic right ICA proximal high grade  stenosis  Daughter request [facility] consultation  VVS will consider elective surgery in the future  Follow up with [facility] as outpt  Cerebral angio scheduled for today  Hypertension  Stable  Permissive hypertension (OK if < 220/120) but gradually normalize in 5-7 days  BP goal 130-150  Avoid hypotension  Hyperlipidemia  Home meds:  Lipitor 10 mg daily prior to admission.  LDL 147, goal < 70  Now on Lipitor 80 mg daily.  Continue statin at discharge  Diabetes  HgbA1c 10.1, goal < 7.0  CBG not in control  SSI  Daughter complains of cost of medication and pt is not compliant  Close follow up with PCP for best regimen  Other Stroke Risk Factors  Advanced age  Hx stroke/TIA  Hx CHF  Other Active Problems  Mild dementia  Anemia   Hospital day # [place] [facility] for Pager information 01/28/2016 2:37 PM  I have personally examined this patient, reviewed notes, independently viewed imaging studies, participated in  medical decision making and plan of care.ROS completed by me personally and pertinent positives fully documented  I have made any additions or clarifications directly to the above note. Agree with note above.   Antony Contras, MD Medical Director [facility] Pager: [phone] 01/28/2016 5:33 PM  To contact Stroke Continuity provider, please refer to [email or web address] After hours, contact General Neurology

## 2016-01-28 NOTE — Progress Notes (Signed)
Patient having bradycardia in the 30's and some PVC's  asymptomatic provider made aware.

## 2016-01-28 NOTE — Procedures (Signed)
S/P 4 vessel cerebral arteriogram. RT CFA approach. Findings. 1.approx  80 % stenosis of RT ICA prox. 2.Large laminar filling defect in LT ICA supraclinoid ICA extending into Lt PCOM. 3Flow noted in Lt MCA ,but mostly from contralateral RT ICA vi aACOM.Marland Kitchen Some from Lt ICA 4.Appro 80 to 85 % stenosis of dominant LT VA origin. 5 .Severe  preocclusive stenosis of RT VA origin.

## 2016-01-28 NOTE — Progress Notes (Signed)
[facility] PROGRESS NOTE  Alexandra Bass [id number] DOB: 07/15/1931 DOA: 01/24/2016   PCP: No primary care provider on file.     Assessment/Plan: Principal Problem:   Acute ischemic left PCA stroke (HCC) Active Problems:   Essential hypertension   Dementia   Type 2 diabetes mellitus with vascular disease (HCC)   Hyperlipidemia   Bilateral carotid artery stenosis   82 y.o.femalewho was transferred to [facility] for possible carotid endarterectomy following a stroke. At baseline she lives at home alone next door to her family. She has a recent diagnosis of mild dementia but is able to care for herself. LKN was on 12/3 in the evening. Her family found her on the floor in her bedroom on 12/4 when the went to check on her. They got her up to a chair and noted that she was unable to move the right side of her body. She was brought to the ED at [facility] for further evaluation. A code stroke was initiated. CT of her head showed an acute versus subacute stroke.     Assessment and plan left PCA large infarct Right ICA high grade stenosis at bifurcation and atherosclerosis at siphon are asymptomatic this time.  MRI - confluent acute infarct throughout the majority of the left PCA territory  CTA Head - Severe stenosis of the left supraclinoid ICA and   PCA stenosis.  CTA Neck - Critical stenosis of the right ICA origin with string sign - severe stenosis of both VA origins. Left ICA proximal nonflow limiting atherosclerosis  2D Echo - OSH - LVH with normal systolic function  LDL - [id number]  HgbA1c 10.1  VTE prophylaxis - Lovenox  Diet Carb Modified Fluid consistency: Thin; Room service appropriate? Yes  aspirin 81 mg daily prior to admission, now on aspirin 325 mg daily and clopidogrel 75 mg daily. Continue DAPT for 3 months and then plavix alone  Discussed with vascular surgery Dr Trula Slade: he does not feel that right ICA needs urgent intervention,follow in the office  in 1 month for further discussions regarding right carotid  . Vascular recommended   IR consult for left supraclinoid internal carotid artery with carotid terminus thromboembolism, this has been requested , Dr Estanislado Pandy has been contacted .Will have catheter angiogram Monday.holding Lovenox. ASA/Plavix ok  Neurology Against intracranial left ICA manipulation given pt advance age, poorly controlled risk factors, baseline dementia. This was conveyed to interventional radiology   Hypertension-  holding off lisinopril , amlodipine and lower dose of  metoprolol. .Need higher BP.Continue with gentle hydration. Closely follow blood pressure trends.  Diabetes mellitus type 2- concern for dietary noncompliance.   patient currently takes Amaryl, tresiba , invokana, januvia , A1c is still not controlled. Discussed with daughter, she is agreeable to transition over to Lantus and NovoLog . Increase Lantus to 40  [place] hemoglobin A1c 10.1ipitor. Lipid panel , LDL 147. Continue statin    History of CHF- 2-D echo done last week in [facility] showed LVH with normal systolic function and impaired relaxation.    Dementiaon Namenda and donepezil.   Anemia- follow CBC.  Fall-patient fell last night, found on the floor, vital signs were stable, pelvic x-ray and CT scan within normal limits   DVT prophylaxsis  Lovenox  Code Status:   DO NOT RESUSCITATE    Family Communication: Discussed in detail with the patient/daughter, all imaging results, lab results explained to the patient   Disposition Plan:  Catheter angiogram today, anticipate  discharge tomorrow     Consultants:   Neurology  Vascular surgery  IR   procedures  None    Antibiotics: Anti-infectives    None         HPI/Subjective: Patient sitting and eating breakfast , remains confused  Objective: Vitals:   01/27/16 2127 01/28/16 0204 01/28/16 0523 01/28/16 0857  BP: (!) 160/57 (!) 147/50 (!) 132/45 (!)  154/50  Pulse: 86 87 60 69  Resp: 18 20 20 16   Temp: 99.1 F (37.3 C) 98.2 F (36.8 C) 98 F (36.7 C) 97.7 F (36.5 C)  TempSrc: Oral Oral Oral Oral  SpO2: 98% 99% 96% 99%  Weight:        Intake/Output Summary (Last 24 hours) at 01/28/16 [id number] Last data filed at 01/27/16 1846  Gross per 24 hour  Intake           353.75 ml  Output                0 ml  Net           353.75 ml    Exam:  Examination:  General exam: Appears calm and comfortable  Respiratory system: Clear to auscultation. Respiratory effort normal. Cardiovascular system: S1 & S2 heard, RRR. No JVD, murmurs, rubs, gallops or clicks. No pedal edema. Gastrointestinal system: Abdomen is nondistended, soft and nontender. No organomegaly or masses felt. Normal bowel sounds heard. Central nervous system: Alert and oriented. No focal neurological deficits. Motor Strength - The patient's strength was 4/5 LUE, 4/5 LLE, 3/5 RUE and 3/5 RLE and pronator drift was present on the right.  Bulk was normal and fasciculations were absent Skin: No rashes, lesions or ulcers Psychiatry: Judgement and insight appear normal. Mood & affect appropriate.     Data Reviewed: I have personally reviewed following labs and imaging studies  Micro Results No results found for this or any previous visit (from the past 240 hour(s)).  Radiology Reports Dg Pelvis 1-2 Views  Result Date: 01/27/2016 CLINICAL DATA:  Fall today. EXAM: PELVIS - 1-2 VIEW COMPARISON:  None. FINDINGS: The cortical margins of the bony pelvis are intact. No fracture. Pubic symphysis and sacroiliac joints are congruent. Both femoral heads are well-seated in the respective acetabula. There is barium within colonic diverticula and rectosigmoid colon from prior barium swallow. Vascular calcifications are seen. IMPRESSION: No evidence of pelvic fracture. Electronically Signed   By: Jeb Levering M.D.   On: 01/27/2016 02:28   Ct Head Wo Contrast  Result Date:  01/27/2016 CLINICAL DATA:  Unwitnessed fall.  Found on floor.  Recent stroke. EXAM: CT HEAD WITHOUT CONTRAST TECHNIQUE: Contiguous axial images were obtained from the base of the skull through the vertex without intravenous contrast. COMPARISON:  Most recent head CT 01/22/2016 FINDINGS: Brain: Expected evolution of left PCA distribution infarct in the temporal occipital lobe from prior exam with associated mild sulcal effacement. No hemorrhagic transformation. No midline shift. No new ischemia. No hemorrhage elsewhere. No subdural or extra-axial fluid collection. Stable atrophy and chronic small vessel ischemia. Vascular: No hyperdense vessel. Carotid terminus thromboembolism on prior CTA is not seen on noncontrast exam. There is skullbase atherosclerosis. Skull: Normal. Negative for fracture or focal lesion. Sinuses/Orbits: Scattered mucosal thickening of the ethmoid air cells. No sinus fluid levels. No mastoid air cell opacification. Other: None. 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 No hemorrhage transformation. No new abnormality is seen. Electronically Signed   By: Jeb Levering M.D.   On: 01/27/2016 00:41  Dg Chest Port 1 View  Result Date: 01/25/2016 CLINICAL DATA:  Hypertension.  Stroke. EXAM: PORTABLE CHEST 1 VIEW COMPARISON:  01/21/2016 FINDINGS: A single AP portable view of the chest demonstrates no focal airspace consolidation or alveolar edema. The lungs are grossly clear. There is no large effusion or pneumothorax. Cardiac and mediastinal contours appear unremarkable. IMPRESSION: No active disease. Electronically Signed   By: Andreas Newport M.D.   On: 01/25/2016 01:08     CBC  Recent Labs Lab 01/24/16 2119 01/26/16 1001 01/27/16 0329  WBC 9.0 8.3 9.2  HGB 11.3* 11.8* 12.2  HCT 35.0* 37.1 37.0  PLT 265 263 267  MCV 92.6 93.2 92.3  MCH 29.9 29.6 30.4  MCHC 32.3 31.8 33.0  RDW 14.2 14.2 14.1  LYMPHSABS 2.9  --   --   MONOABS 1.1*  --   --   EOSABS  0.4  --   --   BASOSABS 0.0  --   --     Chemistries   Recent Labs Lab 01/24/16 2119 01/27/16 0329  NA 134* 135  K 3.7 4.0  CL 103 101  CO2 24 23  GLUCOSE 112* 276*  BUN 31* 30*  CREATININE 1.17* 0.90  CALCIUM 9.4 9.5  AST 28 28  ALT 21 27  ALKPHOS 49 58  BILITOT 0.4 0.3   ------------------------------------------------------------------------------------------------------------------ CrCl cannot be calculated (Unknown ideal weight.). ------------------------------------------------------------------------------------------------------------------ No results for input(s): HGBA1C in the last 72 hours. ------------------------------------------------------------------------------------------------------------------ No results for input(s): CHOL, HDL, LDLCALC, TRIG, CHOLHDL, LDLDIRECT in the last 72 hours. ------------------------------------------------------------------------------------------------------------------ No results for input(s): TSH, T4TOTAL, T3FREE, THYROIDAB in the last 72 hours.  Invalid input(s): FREET3 ------------------------------------------------------------------------------------------------------------------ No results for input(s): VITAMINB12, FOLATE, FERRITIN, TIBC, IRON, RETICCTPCT in the last 72 hours.  Coagulation profile  Recent Labs Lab 01/28/16 0648  INR 0.98    No results for input(s): DDIMER in the last 72 hours.  Cardiac Enzymes No results for input(s): CKMB, TROPONINI, MYOGLOBIN in the last 168 hours.  Invalid input(s): CK ------------------------------------------------------------------------------------------------------------------ Invalid input(s): POCBNP   CBG:  Recent Labs Lab 01/27/16 1148 01/27/16 1648 01/27/16 2046 01/28/16 0611 01/28/16 0829  GLUCAP 356* 347* 244* 213* 228*       Studies: Dg Pelvis 1-2 Views  Result Date: 01/27/2016 CLINICAL DATA:  Fall today. EXAM: PELVIS - 1-2 VIEW COMPARISON:   None. FINDINGS: The cortical margins of the bony pelvis are intact. No fracture. Pubic symphysis and sacroiliac joints are congruent. Both femoral heads are well-seated in the respective acetabula. There is barium within colonic diverticula and rectosigmoid colon from prior barium swallow. Vascular calcifications are seen. IMPRESSION: No evidence of pelvic fracture. Electronically Signed   By: Jeb Levering M.D.   On: 01/27/2016 02:28   Ct Head Wo Contrast  Result Date: 01/27/2016 CLINICAL DATA:  Unwitnessed fall.  Found on floor.  Recent stroke. EXAM: CT HEAD WITHOUT CONTRAST TECHNIQUE: Contiguous axial images were obtained from the base of the skull through the vertex without intravenous contrast. COMPARISON:  Most recent head CT 01/22/2016 FINDINGS: Brain: Expected evolution of left PCA distribution infarct in the temporal occipital lobe from prior exam with associated mild sulcal effacement. No hemorrhagic transformation. No midline shift. No new ischemia. No hemorrhage elsewhere. No subdural or extra-axial fluid collection. Stable atrophy and chronic small vessel ischemia. Vascular: No hyperdense vessel. Carotid terminus thromboembolism on prior CTA is not seen on noncontrast exam. There is skullbase atherosclerosis. Skull: Normal. Negative for fracture or focal lesion. Sinuses/Orbits: Scattered mucosal thickening of the ethmoid  air cells. No sinus fluid levels. No mastoid air cell opacification. Other: None. 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 No hemorrhage transformation. No new abnormality is seen. Electronically Signed   By: Jeb Levering M.D.   On: 01/27/2016 00:41      Lab Results  Component Value Date   HGBA1C 10.1 (H) 01/25/2016   Lab Results  Component Value Date   LDLCALC 147 (H) 01/25/2016   CREATININE 0.90 01/27/2016       Scheduled Meds: . aspirin  300 mg Rectal Daily   Or  . aspirin  325 mg Oral Daily  . atorvastatin  80 mg Oral q1800  .  clopidogrel  75 mg Oral Daily  . enoxaparin (LOVENOX) injection  40 mg Subcutaneous Q24H  . famotidine  20 mg Oral QHS  . insulin aspart  0-9 Units Subcutaneous TID WC  . insulin glargine  35 Units Subcutaneous Daily  . nystatin   Topical TID   Continuous Infusions: . sodium chloride 75 mL/hr at 01/27/16 1715     LOS: 4 days    Time spent: >30 MINS    [facility]  [facility] Pager [phone]. If 7PM-7AM, please contact night-coverage at [email or web address], password [facility] 01/28/2016, 9:42 AM  LOS: 4 days

## 2016-01-28 NOTE — Sedation Documentation (Signed)
Patient denies pain and is resting comfortably.  

## 2016-01-28 NOTE — Progress Notes (Signed)
PT Cancellation Note  Patient Details Name: Alexandra Bass MRN: [id number] DOB: January 17, 1932   Cancelled Treatment:    Reason Eval/Treat Not Completed: Patient at procedure or test/unavailable.  Patient having angiogram at this time.  Will return at later date for PT session.   Despina Pole 01/28/2016, 2:28 PM Carita Pian Sanjuana Kava, [place] Pager [phone]

## 2016-01-28 NOTE — Progress Notes (Signed)
Inpatient Diabetes Program Recommendations  AACE/ADA: New Consensus Statement on Inpatient Glycemic Control (2015)  Target Ranges:  Prepandial:   less than 140 mg/dL      Peak postprandial:   less than 180 mg/dL (1-2 hours)      Critically ill patients:  140 - 180 mg/dL  Results for CHEMAINE, PIERCEFIELD (MRN [id number]) as of 01/28/2016 10:43  Ref. Range 01/27/2016 06:17 01/27/2016 11:48 01/27/2016 16:48 01/27/2016 20:46 01/28/2016 06:11 01/28/2016 08:29  Glucose-Capillary Latest Ref Range: 65 - 99 mg/dL 257 (H) 356 (H) 347 (H) 244 (H) 213 (H) 228 (H)   Review of Glycemic Control  Diabetes history: DM2 Outpatient Diabetes medications: Amary 4 mg BID, Tresiba 10 units QPM, Invokana 300 mg QAM, Januvia 100 mg QHS Current orders for Inpatient glycemic control: Lantus 40 units daily, Novolog 0-9 units TID with meals  Inpatient Diabetes Program Recommendations: Correction (SSI): Please consider increasing Novolog correction to Moderate scale and add Novolog bedtime correction scale.  If patient will remain NPO, please change frequency of CBGs and Novolog to Q4H.  Thanks, Barnie Alderman, RN, MSN, CDE Diabetes Coordinator Inpatient Diabetes Program [phone] (Team Pager from 8am to 5pm)

## 2016-01-29 DIAGNOSIS — I639 Cerebral infarction, unspecified: Secondary | ICD-10-CM | POA: Diagnosis not present

## 2016-01-29 DIAGNOSIS — R531 Weakness: Secondary | ICD-10-CM | POA: Diagnosis not present

## 2016-01-29 DIAGNOSIS — R1310 Dysphagia, unspecified: Secondary | ICD-10-CM | POA: Diagnosis not present

## 2016-01-29 DIAGNOSIS — E1159 Type 2 diabetes mellitus with other circulatory complications: Secondary | ICD-10-CM | POA: Diagnosis not present

## 2016-01-29 DIAGNOSIS — I63532 Cerebral infarction due to unspecified occlusion or stenosis of left posterior cerebral artery: Secondary | ICD-10-CM | POA: Diagnosis not present

## 2016-01-29 DIAGNOSIS — I6789 Other cerebrovascular disease: Secondary | ICD-10-CM | POA: Diagnosis not present

## 2016-01-29 DIAGNOSIS — I6523 Occlusion and stenosis of bilateral carotid arteries: Secondary | ICD-10-CM | POA: Diagnosis not present

## 2016-01-29 DIAGNOSIS — R5381 Other malaise: Secondary | ICD-10-CM | POA: Diagnosis not present

## 2016-01-29 DIAGNOSIS — F0150 Vascular dementia without behavioral disturbance: Secondary | ICD-10-CM | POA: Diagnosis not present

## 2016-01-29 DIAGNOSIS — I739 Peripheral vascular disease, unspecified: Secondary | ICD-10-CM | POA: Diagnosis not present

## 2016-01-29 DIAGNOSIS — F0390 Unspecified dementia without behavioral disturbance: Secondary | ICD-10-CM | POA: Diagnosis not present

## 2016-01-29 DIAGNOSIS — I10 Essential (primary) hypertension: Secondary | ICD-10-CM | POA: Diagnosis not present

## 2016-01-29 DIAGNOSIS — E785 Hyperlipidemia, unspecified: Secondary | ICD-10-CM | POA: Diagnosis not present

## 2016-01-29 LAB — COMPREHENSIVE METABOLIC PANEL
ALBUMIN: 2.8 g/dL — AB (ref 3.5–5.0)
ALK PHOS: 52 U/L (ref 38–126)
ALT: 39 U/L (ref 14–54)
ANION GAP: 9 (ref 5–15)
AST: 41 U/L (ref 15–41)
BILIRUBIN TOTAL: 0.6 mg/dL (ref 0.3–1.2)
BUN: 25 mg/dL — AB (ref 6–20)
CALCIUM: 8.9 mg/dL (ref 8.9–10.3)
CO2: 23 mmol/L (ref 22–32)
Chloride: 104 mmol/L (ref 101–111)
Creatinine, Ser: 0.72 mg/dL (ref 0.44–1.00)
GFR calc Af Amer: >60 mL/min (ref >60)
GFR calc non Af Amer: >60 mL/min (ref >60)
GLUCOSE: 127 mg/dL — AB (ref 65–99)
Potassium: 3.7 mmol/L (ref 3.5–5.1)
Sodium: 136 mmol/L (ref 135–145)
TOTAL PROTEIN: 6.1 g/dL — AB (ref 6.5–8.1)

## 2016-01-29 LAB — CBC
HEMATOCRIT: 35.7 % — AB (ref 36.0–46.0)
HEMOGLOBIN: 11.3 g/dL — AB (ref 12.0–15.0)
MCH: 29.6 pg (ref 26.0–34.0)
MCHC: 31.7 g/dL (ref 30.0–36.0)
MCV: 93.5 fL (ref 78.0–100.0)
Platelets: 243 10*3/uL (ref 150–400)
RBC: 3.82 MIL/uL — AB (ref 3.87–5.11)
RDW: 14.3 % (ref 11.5–15.5)
WBC: 8.4 10*3/uL (ref 4.0–10.5)

## 2016-01-29 LAB — GLUCOSE, CAPILLARY
GLUCOSE-CAPILLARY: 186 mg/dL — AB (ref 65–99)
Glucose-Capillary: 138 mg/dL — ABNORMAL HIGH (ref 65–99)
Glucose-Capillary: 223 mg/dL — ABNORMAL HIGH (ref 65–99)

## 2016-01-29 MED ORDER — CIPROFLOXACIN HCL 250 MG PO TABS: 250.0000 mg | ORAL_TABLET | Freq: Two times a day (BID) | ORAL | 0 refills | Status: AC

## 2016-01-29 MED ORDER — PANTOPRAZOLE SODIUM 40 MG PO TBEC: 40.0000 mg | DELAYED_RELEASE_TABLET | Freq: Every day | ORAL | 2 refills | Status: AC

## 2016-01-29 MED ORDER — INSULIN GLARGINE 100 UNIT/ML ~~LOC~~ SOLN: 40.0000 [IU] | Freq: Every day | SUBCUTANEOUS | 11 refills | Status: AC

## 2016-01-29 MED ORDER — TRAMADOL HCL 50 MG PO TABS: 50.0000 mg | ORAL_TABLET | Freq: Every day | ORAL | 0 refills | Status: AC

## 2016-01-29 MED ORDER — ASPIRIN 325 MG PO TABS: 325.0000 mg | ORAL_TABLET | Freq: Every day | ORAL | 3 refills | Status: AC

## 2016-01-29 MED ORDER — ATORVASTATIN CALCIUM 80 MG PO TABS: 80.0000 mg | ORAL_TABLET | Freq: Every day | ORAL | 0 refills | Status: AC

## 2016-01-29 MED ORDER — CIPROFLOXACIN HCL 250 MG PO TABS
250.0000 mg | ORAL_TABLET | Freq: Two times a day (BID) | ORAL | Status: DC
  Administered 2016-01-29: 250 mg via ORAL
  Filled 2016-01-29: qty 1

## 2016-01-29 MED ORDER — CLOPIDOGREL BISULFATE 75 MG PO TABS: 75.0000 mg | ORAL_TABLET | Freq: Every day | ORAL | 2 refills | Status: AC

## 2016-01-29 NOTE — Clinical Social Work Note (Signed)
Pt is ready for discharge today and go to [facility]. Pt's son is aware and agreeable to discharge plan. [employer] has given Josem Kaufmann [phone]). Facility has receive discharge information and is ready to to admit pt. RN to call report. [facility] will provide transportation. CSW is signing off as no further needs identifeid.   Darden Dates, MSW, LCSW  Clinical Social Worker  [phone]

## 2016-01-29 NOTE — Discharge Summary (Signed)
Physician Discharge Summary  Alexandra Bass MRN: [id number] DOB/AGE: 09/15/1931 82 y.o.  PCP: No primary care provider on file.   Admit date: 01/24/2016 Discharge date: 01/29/2016  Discharge Diagnoses:    Principal Problem:   Acute ischemic left PCA stroke [facility]) Active Problems:   Essential hypertension   Dementia   Type 2 diabetes mellitus with vascular disease ([place])   Hyperlipidemia   Bilateral carotid artery stenosis    Follow-up recommendations Follow-up with PCP in 3-5 days , including all  additional recommended appointments as below Follow-up CBC, CMP in 3-5 days Follow-up with Luanne Bras, MD , to discuss further regarding management of Large laminar filling defect in LT ICA supraclinoid ICA  Follow-up with Serafina Mitchell, MD, regarding right ICA stenosis Follow-up with neurology Garvin Fila, MD, as scheduled Monitor Accu-Cheks 3 times a day Continue follow-up on the results of the urine culture from 12/10    Current Discharge Medication List    START taking these medications   Details  aspirin 325 MG tablet Take 1 tablet (325 mg total) by mouth daily. Qty: 30 tablet, Refills: 3    ciprofloxacin (CIPRO) 250 MG tablet Take 1 tablet (250 mg total) by mouth 2 (two) times daily. Qty: 14 tablet, Refills: 0    clopidogrel (PLAVIX) 75 MG tablet Take 1 tablet (75 mg total) by mouth daily. Qty: 30 tablet, Refills: 2    insulin glargine (LANTUS) 100 UNIT/ML injection Inject 0.4 mLs (40 Units total) into the skin daily. Qty: 10 mL, Refills: 11    pantoprazole (PROTONIX) 40 MG tablet Take 1 tablet (40 mg total) by mouth daily. Qty: 30 tablet, Refills: 2      CONTINUE these medications which have CHANGED   Details  atorvastatin (LIPITOR) 80 MG tablet Take 1 tablet (80 mg total) by mouth daily at 6 PM. Qty: 30 tablet, Refills: 0    traMADol (ULTRAM) 50 MG tablet Take 1 tablet (50 mg total) by mouth daily at 12 noon. Qty: 30 tablet, Refills: 0       CONTINUE these medications which have NOT CHANGED   Details  alendronate (FOSAMAX) 70 MG tablet Take 70 mg by mouth every Monday.     amLODipine (NORVASC) 5 MG tablet Take 5 mg by mouth every evening.     glimepiride (AMARYL) 4 MG tablet Take 4 mg by mouth 2 (two) times daily.    lisinopril (PRINIVIL,ZESTRIL) 10 MG tablet Take 10 mg by mouth every morning.     metoprolol succinate (TOPROL-XL) 100 MG 24 hr tablet Take 100 mg by mouth daily at 12 noon.     Multiple Vitamins-Minerals (MULTIVITAMIN PO) Take 1 tablet by mouth every evening.    NAMZARIC 28-10 MG CP24 Take 1 tablet by mouth at bedtime.       STOP taking these medications     aspirin EC 81 MG tablet      insulin degludec (TRESIBA FLEXTOUCH) 100 UNIT/ML SOPN FlexTouch Pen      INVOKANA 300 MG TABS tablet      JANUVIA 100 MG tablet      omeprazole (PRILOSEC) 20 MG capsule      ranitidine (ZANTAC) 300 MG tablet          Discharge Condition: Stable   Discharge Instructions Get Medicines reviewed and adjusted: Please take all your medications with you for your next visit with your Primary MD  Please request your Primary MD to go over all hospital tests and procedure/radiological results at  the follow up, please ask your Primary MD to get all Hospital records sent to his/her office.  If you experience worsening of your admission symptoms, develop shortness of breath, life threatening emergency, suicidal or homicidal thoughts you must seek medical attention immediately by calling 911 or calling your MD immediately if symptoms less severe.  You must read complete instructions/literature along with all the possible adverse reactions/side effects for all the Medicines you take and that have been prescribed to you. Take any new Medicines after you have completely understood and accpet all the possible adverse reactions/side effects.   Do not drive when taking Pain medications.   Do not take more than prescribed  Pain, Sleep and Anxiety Medications  Special Instructions: If you have smoked or chewed Tobacco in the last 2 yrs please stop smoking, stop any regular Alcohol and or any Recreational drug use.  Wear Seat belts while driving.  Please note  You were cared for by a hospitalist during your hospital stay. Once you are discharged, your primary care physician will handle any further medical issues. Please note that NO REFILLS for any discharge medications will be authorized once you are discharged, as it is imperative that you return to your primary care physician (or establish a relationship with a primary care physician if you do not have one) for your aftercare needs so that they can reassess your need for medications and monitor your lab values.     Allergies  Allergen Reactions  . Codeine Other (See Comments)    Unknown  . Diphenhydramine-Zinc Acetate Other (See Comments)    Unknown  . Penicillins Other (See Comments)    Unknown      Disposition: SNF   Consults:  Neurology Vascular surgery Interventional radiology     Significant Diagnostic Studies:  Dg Pelvis 1-2 Views  Result Date: 01/27/2016 CLINICAL DATA:  Fall today. EXAM: PELVIS - 1-2 VIEW COMPARISON:  None. FINDINGS: The cortical margins of the bony pelvis are intact. No fracture. Pubic symphysis and sacroiliac joints are congruent. Both femoral heads are well-seated in the respective acetabula. There is barium within colonic diverticula and rectosigmoid colon from prior barium swallow. Vascular calcifications are seen. IMPRESSION: No evidence of pelvic fracture. Electronically Signed   By: Jeb Levering M.D.   On: 01/27/2016 02:28   Ct Head Wo Contrast  Result Date: 01/27/2016 CLINICAL DATA:  Unwitnessed fall.  Found on floor.  Recent stroke. EXAM: CT HEAD WITHOUT CONTRAST TECHNIQUE: Contiguous axial images were obtained from the base of the skull through the vertex without intravenous contrast. COMPARISON:   Most recent head CT 01/22/2016 FINDINGS: Brain: Expected evolution of left PCA distribution infarct in the temporal occipital lobe from prior exam with associated mild sulcal effacement. No hemorrhagic transformation. No midline shift. No new ischemia. No hemorrhage elsewhere. No subdural or extra-axial fluid collection. Stable atrophy and chronic small vessel ischemia. Vascular: No hyperdense vessel. Carotid terminus thromboembolism on prior CTA is not seen on noncontrast exam. There is skullbase atherosclerosis. Skull: Normal. Negative for fracture or focal lesion. Sinuses/Orbits: Scattered mucosal thickening of the ethmoid air cells. No sinus fluid levels. No mastoid air cell opacification. Other: None. 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 No hemorrhage transformation. No new abnormality is seen. Electronically Signed   By: Jeb Levering M.D.   On: 01/27/2016 00:41   Dg Chest Port 1 View  Result Date: 01/25/2016 CLINICAL DATA:  Hypertension.  Stroke. EXAM: PORTABLE CHEST 1 VIEW COMPARISON:  01/21/2016 FINDINGS: A  single AP portable view of the chest demonstrates no focal airspace consolidation or alveolar edema. The lungs are grossly clear. There is no large effusion or pneumothorax. Cardiac and mediastinal contours appear unremarkable. IMPRESSION: No active disease. Electronically Signed   By: Andreas Newport M.D.   On: 01/25/2016 01:08        Filed Weights   01/24/16 1925  Weight: 60.4 kg (133 lb 2.5 oz)       Labs: Results for orders placed or performed during the hospital encounter of 01/24/16 (from the past 48 hour(s))  Urinalysis, Routine w reflex microscopic     Status: Abnormal   Collection Time: 01/27/16 10:43 AM  Result Value Ref Range   Color, Urine YELLOW YELLOW   APPearance CLOUDY (A) CLEAR   Specific Gravity, Urine 1.021 1.005 - 1.030   pH 5.0 5.0 - 8.0   Glucose, UA >=500 (A) NEGATIVE mg/dL   Hgb urine dipstick SMALL (A) NEGATIVE   Bilirubin  Urine NEGATIVE NEGATIVE   Ketones, ur NEGATIVE NEGATIVE mg/dL   Protein, ur NEGATIVE NEGATIVE mg/dL   Nitrite NEGATIVE NEGATIVE   Leukocytes, UA LARGE (A) NEGATIVE   RBC / HPF TOO NUMEROUS TO COUNT 0 - 5 RBC/hpf   WBC, UA TOO NUMEROUS TO COUNT 0 - 5 WBC/hpf   Bacteria, UA MANY (A) NONE SEEN   Squamous Epithelial / LPF 0-5 (A) NONE SEEN   WBC Clumps PRESENT    Budding Yeast PRESENT   Glucose, capillary     Status: Abnormal   Collection Time: 01/27/16 11:48 AM  Result Value Ref Range   Glucose-Capillary 356 (H) 65 - 99 mg/dL   Comment 1 Notify RN    Comment 2 Document in Chart   Glucose, capillary     Status: Abnormal   Collection Time: 01/27/16  4:48 PM  Result Value Ref Range   Glucose-Capillary 347 (H) 65 - 99 mg/dL   Comment 1 Notify RN    Comment 2 Document in Chart   Glucose, capillary     Status: Abnormal   Collection Time: 01/27/16  8:46 PM  Result Value Ref Range   Glucose-Capillary 244 (H) 65 - 99 mg/dL   Comment 1 Notify RN    Comment 2 Document in Chart   Glucose, capillary     Status: Abnormal   Collection Time: 01/28/16  6:11 AM  Result Value Ref Range   Glucose-Capillary 213 (H) 65 - 99 mg/dL   Comment 1 Notify RN    Comment 2 Document in Chart   Protime-INR     Status: None   Collection Time: 01/28/16  6:48 AM  Result Value Ref Range   Prothrombin Time 13.0 11.4 - 15.2 seconds   INR 0.98   Glucose, capillary     Status: Abnormal   Collection Time: 01/28/16  8:29 AM  Result Value Ref Range   Glucose-Capillary 228 (H) 65 - 99 mg/dL  Glucose, capillary     Status: Abnormal   Collection Time: 01/28/16 11:45 AM  Result Value Ref Range   Glucose-Capillary 182 (H) 65 - 99 mg/dL   Comment 1 Notify RN    Comment 2 Document in Chart   Glucose, capillary     Status: Abnormal   Collection Time: 01/28/16  2:27 PM  Result Value Ref Range   Glucose-Capillary 162 (H) 65 - 99 mg/dL  Glucose, capillary     Status: Abnormal   Collection Time: 01/28/16  5:15 PM   Result Value Ref Range  Glucose-Capillary 135 (H) 65 - 99 mg/dL   Comment 1 Notify RN    Comment 2 Document in Chart   Glucose, capillary     Status: None   Collection Time: 01/28/16  9:51 PM  Result Value Ref Range   Glucose-Capillary 98 65 - 99 mg/dL   Comment 1 Notify RN    Comment 2 Document in Chart   CBC     Status: Abnormal   Collection Time: 01/29/16  3:22 AM  Result Value Ref Range   WBC 8.4 4.0 - 10.5 K/uL   RBC 3.82 (L) 3.87 - 5.11 MIL/uL   Hemoglobin 11.3 (L) 12.0 - 15.0 g/dL   HCT 35.7 (L) 36.0 - 46.0 %   MCV 93.5 78.0 - 100.0 fL   MCH 29.6 26.0 - 34.0 pg   MCHC 31.7 30.0 - 36.0 g/dL   RDW 14.3 11.5 - 15.5 %   Platelets 243 150 - 400 K/uL  Comprehensive metabolic panel     Status: Abnormal   Collection Time: 01/29/16  3:22 AM  Result Value Ref Range   Sodium 136 135 - 145 mmol/L   Potassium 3.7 3.5 - 5.1 mmol/L   Chloride 104 101 - 111 mmol/L   CO2 23 22 - 32 mmol/L   Glucose, Bld 127 (H) 65 - 99 mg/dL   BUN 25 (H) 6 - 20 mg/dL   Creatinine, Ser 0.72 0.44 - 1.00 mg/dL   Calcium 8.9 8.9 - 10.3 mg/dL   Total Protein 6.1 (L) 6.5 - 8.1 g/dL   Albumin 2.8 (L) 3.5 - 5.0 g/dL   AST 41 15 - 41 U/L   ALT 39 14 - 54 U/L   Alkaline Phosphatase 52 38 - 126 U/L   Total Bilirubin 0.6 0.3 - 1.2 mg/dL   GFR calc non Af Amer >60 >60 mL/min   GFR calc Af Amer >60 >60 mL/min    Comment: (NOTE) The eGFR has been calculated using the CKD EPI equation. This calculation has not been validated in all clinical situations. eGFR's persistently <60 mL/min signify possible Chronic Kidney Disease.    Anion gap 9 5 - 15  Glucose, capillary     Status: Abnormal   Collection Time: 01/29/16  6:06 AM  Result Value Ref Range   Glucose-Capillary 138 (H) 65 - 99 mg/dL   Comment 1 Notify RN    Comment 2 Document in Chart      Lipid Panel     Component Value Date/Time   CHOL 236 (H) 01/25/2016 0500   TRIG 277 (H) 01/25/2016 0500   HDL 34 (L) 01/25/2016 0500   CHOLHDL 6.9  01/25/2016 0500   VLDL 55 (H) 01/25/2016 0500   LDLCALC 147 (H) 01/25/2016 0500     Lab Results  Component Value Date   HGBA1C 10.1 (H) 01/25/2016     Lab Results  Component Value Date   LDLCALC 147 (H) 01/25/2016   CREATININE 0.72 01/29/2016     HPI :  82 y.o.femalewho was transferred to [facility] for possible carotid endarterectomy following a stroke.At baseline she lives at home alone next door to her family. She has a recent diagnosis of mild dementia but is able to care for herself. LKN was on 12/3 in the evening. Her family found her on the floor in her bedroom on 12/4 when the went to check on her. They got her up to a chair and noted that she was unable to move the right side of her body.She  was brought to the ED at [facility] for further evaluation. A code stroke was initiated. CTof her head showed an acute versussubacute stroke.    HOSPITAL COURSE:  left PCA large infarct Right ICA high grade stenosis at bifurcation and atherosclerosis at siphon are asymptomatic this time.  MRI- confluent acute infarct throughout the majority of the left PCA territory  CTA Head - Severe stenosis of the left supraclinoid ICA and   PCA stenosis.  CTA Neck-Critical stenosis of the right ICA origin with string sign - severe stenosis of both VA origins.Left ICA proximal nonflow limiting atherosclerosis  2D Echo- OSH - LVH with normal systolic function  [id number]  [id number]  VTE prophylaxis- Lovenox  Diet Carb Modified regular diet thin liquids  aspirin 81 mg dailyprior to admission, now on aspirin 325 mg daily and clopidogrel 75 mg daily. Continue DAPT for 3 months and then plavix alone  Discussed with vascular surgery Dr Trula Slade: he does not feel that right ICA needs urgent intervention,follow in the office in 1 month for further discussions regarding right carotid  . Vascular recommended   IR consult for left supraclinoid internal carotid artery with carotid terminus  thromboembolism, this has been requested , Dr Estanislado Pandy has been contacted .Will have catheter angiogram 12/11 showed laminar filling defect in LT ICA supraclinoid ICA ,, right proximal ICA stenosis of 80%. Further interventions to be planned by vascular surgery and interventional radiology  Neurology Dr Erlinda Hong recommends  Against intracranial left ICA manipulation given pt advance age, poorly controlled risk factors, baseline dementia. This was conveyed to interventional radiology, vascular and Dr Leonie Man is aware    Hypertension- resume lisinopril ,amlodipine and lower dose of metoprolol. .   Diabetes mellitus type 2- concern for dietary noncompliance.   patient currently takes Amaryl, tresiba , invokana, januvia , A1c is still not controlled. Discussed with daughter, she is agreeable to transition over to Lantus and Amaryl alone, continue Accu-Cheks 3 times a day  Urinary tract infection-urine culture pending but UA from 12/10 was positive for nitrites, suspect asymptomatic bacteriuria. Patient also had red blood cells too numerous to count. Recommend outpatient  urology evaluation to be arranged for by PCP. No gross hematuria noted. Will treat UTI with Cipro as the patient is allergic to penicillin.  [place] hemoglobin A1c 10.1ipitor. Lipid panel , LDL 147. Continue statin    History of CHF- 2-D echo done last week in [facility] showed LVH with normal systolic function and impaired relaxation.    Dementiaon Namenda and donepezil.   Anemia- follow CBC. Hemoglobin remained stable around 11.3-12.2   Fall-patient fell last night, found on the floor, vital signs were stable, pelvic x-ray and CT scan within normal limits     Discharge Exam:  Blood pressure (!) 152/48, pulse 93, temperature 97.5 F (36.4 C), temperature source Oral, resp. rate 20, weight 60.4 kg (133 lb 2.5 oz), SpO2 97 %. General exam: Appears calm and comfortable  Respiratory system: Clear to  auscultation. Respiratory effort normal. Cardiovascular system: S1 & S2 heard, RRR. No JVD, murmurs, rubs, gallops or clicks. No pedal edema. Gastrointestinal system: Abdomen is nondistended, soft and nontender. No organomegaly or masses felt. Normal bowel sounds heard. Central nervous system: Alert and oriented. No focal neurological deficits. Motor Strength -The patient's strength was 4/5 LUE, 4/5 LLE, 3/5 RUE and 3/5 RLEand pronator drift was present on the right. Bulk was normal and fasciculations were absent     Follow-up Information    Primary care provider.  Schedule an appointment as soon as possible for a visit in 2 day(s).   Why:  Hospital follow-up       Annamarie Major, MD. Call.   Specialties:  Vascular Surgery, Cardiology Why:  Hospital follow-up. Patient needs follow-up in one month Contact information: [place] [address] [phone]        Rob Hickman, MD. Call in 2 day(s).   Specialty:  Interventional Radiology Why:  Hospital follow-up, call to make appointment Contact information: [address] [place] [address] [phone]           Signed: Reyne Dumas 01/29/2016, 9:25 AM        Time spent >45 mins

## 2016-01-29 NOTE — Progress Notes (Signed)
STROKE TEAM PROGRESS NOTE   SUBJECTIVE (INTERVAL HISTORY) Patient up in bed.   cerebral angio y`day Showed 80% proximal right ICA stenosis with large filling defect in the left supraclinoid ICA and severe right vertebral origin and left vertebral origin stenosis. No new complaints.   OBJECTIVE Temp:  [97.5 F (36.4 C)-98.5 F (36.9 C)] 97.5 F (36.4 C) (12/12 0906) Pulse Rate:  [61-93] 93 (12/12 0906) Cardiac Rhythm: Normal sinus rhythm (12/11 1900) Resp:  [13-38] 20 (12/12 0906) BP: (100-166)/(42-61) 152/48 (12/12 0906) SpO2:  [90 %-100 %] 97 % (12/12 0906)  CBC:   Recent Labs Lab 01/24/16 2119  01/27/16 0329 01/29/16 0322  WBC 9.0  < > 9.2 8.4  NEUTROABS 4.6  --   --   --   HGB 11.3*  < > 12.2 11.3*  HCT 35.0*  < > 37.0 35.7*  MCV 92.6  < > 92.3 93.5  PLT 265  < > 267 243  < > = values in this interval not displayed.  Basic Metabolic Panel:   Recent Labs Lab 01/27/16 0329 01/29/16 0322  NA 135 136  K 4.0 3.7  CL 101 104  CO2 23 23  GLUCOSE 276* 127*  BUN 30* 25*  CREATININE 0.90 0.72  CALCIUM 9.5 8.9    Lipid Panel:     Component Value Date/Time   CHOL 236 (H) 01/25/2016 0500   TRIG 277 (H) 01/25/2016 0500   HDL 34 (L) 01/25/2016 0500   CHOLHDL 6.9 01/25/2016 0500   VLDL 55 (H) 01/25/2016 0500   LDLCALC 147 (H) 01/25/2016 0500   HgbA1c:  Lab Results  Component Value Date   HGBA1C 10.1 (H) 01/25/2016    IMAGING  Dg Chest Port 1 View 01/25/2016 No active disease.   CT at OSH  Left PCA distribution hypoattenuating lesion, suggestive of a late acute to subacute ischemic infarction. No hemorrhage was seen.   CTA neck at OSH  Critical stenosis of the right ICA origin with string sign, with distal apparently normal lumen, as well as  severe atherosclerotic stenoses of the bilateral vertebral artery origins.  Left ICA origin had calcified plaque but no hemodynamically significant stenosis, with intact flow of the imaged segment of the distal left  ICA.   CTA head at OSH  Severe stenosis of the left supraclinoid ICA with carotid terminus thromboembolism.  Fetal origin of the left PCA was also noted.   MRI brain at OSH Confluent acute infarct throughout the majority of the left PCA territory, including involvement of the left thalamus and mesial left temporal lobe.   Carotid ultrasound at OSH Greater than 70% proximal right ICA stenosis, felt likely to be critical by the interpreting physician. Also noted was heavily calcified plaque in left carotid bulb with difficulty detecting flow at this level due to shadowing; left carotid occlusion could not be excluded.   Echocardiogram at OSH  LVH with normal systolic function but impaired relaxation. No mural thrombus mentioned in the report.   EKG at OSH - NSR.   Cerebral angio 01/28/2016 :  1.approx  80 % stenosis of RT ICA prox. 2.Large laminar filling defect in LT ICA supraclinoid ICA extending into Lt PCOM. 3Flow noted in Lt MCA ,but mostly from contralateral RT ICA vi aACOM.Marland Kitchen Some from Lt ICA 4.Appro 80 to 85 % stenosis of dominant LT VA origin. 5 .Severe  preocclusive stenosis of RT VA origin.   PHYSICAL EXAM  General - Well nourished, well developed, in no apparent distress.  Ophthalmologic - Fundi not visualized due to noncooperation.  Cardiovascular - Regular rate and rhythm.  Mental Status -  Awake alert, orientated to self only, not orientated to age, place, time or people. Able to follow limited simple questions. Answer questions with "yes" only, difficult with naming but able to repeat simple sentences.   Cranial Nerves II - XII - II - not blink to visual threat on the right. III, IV, VI - Extraocular movements intact. V - Facial sensation intact bilaterally. VII - Facial movement intact bilaterally. VIII - hard of hearing & vestibular intact bilaterally. X - Palate elevates symmetrically. XI - Chin turning & shoulder shrug intact bilaterally. XII - Tongue  protrusion intact.  Motor Strength - The patient's strength was 4/5 throughout.  Bulk was normal and fasciculations were absent.   Motor Tone - Muscle tone was assessed at the neck and appendages and was normal.  Reflexes - The patient's reflexes were 1+ in all extremities and she had no pathological reflexes.  Sensory - Light touch, temperature/pinprick were assessed and were symmetrical.    Coordination - not cooperative on exam.  Tremor was absent.  Gait and Station - not tested.   ASSESSMENT/PLAN Ms. ADANELLY ALTMAN is a 82 y.o. female with history of mild dementia, previous stroke, and diabetes mellitus presenting after her family found her on the floor in her bedroom on 12/4.  She did not receive IV t-PA due to initially being seen at an outside hospital.  Stroke:  Dominant left PCA large infarct, most likely due to large vessel disease from left ICA proximal and distal atherosclerosis as pt has fetal PCA. Right ICA high grade stenosis at bifurcation and atherosclerosis at siphon are asymptomatic this time. Stroke risk factor DM, HLD  Resultant  Right hemianopia  MRI - confluent acute infarct throughout the majority of the left PCA territory  CTA Head - significant athero at left supraclinoid ICA with fetal PCA stenosis.  CTA Neck - Critical stenosis of the right ICA origin with string sign - severe stenosis of both VA origins. Left ICA proximal nonflow limiting atherosclerosis  2D Echo - OSH - LVH with normal systolic function  Cerebral angio scheduled for today  LDL - 147  HgbA1c 10.1  VTE prophylaxis - Lovenox Diet heart healthy/carb modified Room service appropriate? Yes; Fluid consistency: Thin Diet - low sodium heart healthy  aspirin 81 mg daily prior to admission, now on aspirin 325 mg daily and clopidogrel 75 mg daily. Continue DAPT for 3 months and then plavix alone.  Patient counseled to be compliant with her antithrombotic medications  Ongoing aggressive  stroke risk factor management  Therapy recommendations:  SNF   Disposition: SNF   Intracranial left carotid stenosis  Left ICA siphon significant athero with left fetal PCA stenosis - cause of current stroke  Recommend maximized medical treatment first - on DAPT and high dose lipitor  Against intracranial left ICA manipulation given pt advance age, poorly controlled risk factors, baseline dementia, current stable neuro deficit and risk of PCA hemorrhagic transformation.   Cerebral angio scheduled for today  Asymptomatic right ICA proximal high grade stenosis  Daughter request VVS consultation  VVS will consider elective surgery in the future  Follow up with VVS as outpt  Cerebral angio scheduled for today  Hypertension  Stable  Permissive hypertension (OK if < 220/120) but gradually normalize in 5-7 days  BP goal 130-150  Avoid hypotension  Hyperlipidemia  Home meds:  Lipitor 10 mg daily  prior to admission.  LDL 147, goal < 70  Now on Lipitor 80 mg daily.  Continue statin at discharge  Diabetes  HgbA1c 10.1, goal < 7.0  CBG not in control  SSI  Daughter complains of cost of medication and pt is not compliant  Close follow up with PCP for best regimen  Other Stroke Risk Factors  Advanced age  Hx stroke/TIA  Hx CHF  Other Active Problems  Mild dementia  Anemia   Hospital day # [place] for Pager information 01/29/2016 12:57 PM  I have personally examined this patient, reviewed notes, independently viewed imaging studies, participated in medical decision making and plan of care.ROS completed by me personally and pertinent positives fully documented  I have made any additions or clarifications directly to the above note.  I had a long telephonic discussion with patient's son [name] over the phone who is health power of attorney. I explained to him that the patient's cerebral angiogram shows multi-vascular  extracranial and intracranial stenosis and patient is at significant risk for recurrent strokes. It may be possible to do endovascular stenting but patient may need this at multiple locations. The patient's son agrees that the patient has baseline dementia and poor quality of life and hence he chose not to be aggressive with intervention at the current time. I am in agreement and recommend aspirin and Plavix for 3 months followed by Plavix alone. Patient will be transferred to skilled nursing facility. Discussed with Dr.Abrol. Greater than 50% time during this 25 minute visit was spent on coordination of care about stroke risk, prevention and treatment Stroke team will sign off. Kindly call for questions. Antony Contras, MD Medical Director [facility] Pager: [phone] 01/29/2016 12:57 PM  To contact Stroke Continuity provider, please refer to [email or web address] After hours, contact General Neurology

## 2016-01-29 NOTE — Progress Notes (Signed)
SLP Cancellation Note  Patient Details Name: MAKYLA OLALDE MRN: [id number] DOB: 11/01/1931   Cancelled treatment:        Pt with MD when attempted to see for aphasia tx 1st attempt, 2nd attempt getting cleaned with RN tech   [place] Siren 01/29/2016, 3:51 PM  Orbie Pyo Colvin Caroli.Ed [employer] [phone]

## 2016-01-29 NOTE — Progress Notes (Signed)
Pharmacy Antibiotic Note  Alexandra Bass is a 82 y.o. female admitted on 01/24/2016 with a stroke with associated right-sided hemiplegia. Patient is also believed to have a UTI.   Pharmacy has been consulted for ciprofloxacin dosing. Patient is afebrile with WBC at 8.4. UA shows numerous WBC, 0-5 squamous epithelial cells, and large leukocytes. SCr WNL at 0.72 and CrCl ~ 50 ml/min.  Plan: Ciprofloxacin 250 mg every 12 hours  Monitor length of therapy and culture Monitor renal function for dose adjustment needs.   Weight: 133 lb 2.5 oz (60.4 kg)  Temp (24hrs), Avg:97.9 F (36.6 C), Min:97.5 F (36.4 C), Max:98.5 F (36.9 C)   Recent Labs Lab 01/24/16 2119 01/26/16 1001 01/27/16 0329 01/29/16 0322  WBC 9.0 8.3 9.2 8.4  CREATININE 1.17*  --  0.90 0.72    CrCl cannot be calculated (Unknown ideal weight.).    Allergies  Allergen Reactions  . Codeine Other (See Comments)    Unknown  . Diphenhydramine-Zinc Acetate Other (See Comments)    Unknown  . Penicillins Other (See Comments)    Unknown    Antimicrobials this admission: 12/12 Cipro >>>  Microbiology results: 12/12  UCx: needs collected    Thank you for allowing pharmacy to be a part of this patient's care.  Ihor Austin, PharmD PGY1 Pharmacy Resident Pager: [phone]  01/29/2016 9:35 AM

## 2016-01-29 NOTE — Clinical Social Work Placement (Signed)
   CLINICAL SOCIAL WORK PLACEMENT  NOTE  Date:  01/29/2016  Patient Details  Name: Alexandra Bass MRN: [id number] Date of Birth: 1931-09-21  Clinical Social Work is seeking post-discharge placement for this patient at the [place] level of care (*CSW will initial, date and re-position this form in  chart as items are completed):  Yes   Patient/family provided with [facility] Work Department's list of facilities offering this level of care within the geographic area requested by the patient (or if unable, by the patient's family).  Yes   Patient/family informed of their freedom to choose among providers that offer the needed level of care, that participate in Medicare, Medicaid or managed care program needed by the patient, have an available bed and are willing to accept the patient.  Yes   Patient/family informed of [facility]'s ownership interest in [facility] and [facility], as well as of the fact that they are under no obligation to receive care at these facilities.  PASRR submitted to EDS on       PASRR number received on       Existing PASRR number confirmed on 01/25/16     FL2 transmitted to all facilities in geographic area requested by pt/family on 01/25/16     FL2 transmitted to all facilities within larger geographic area on       Patient informed that his/her managed care company has contracts with or will negotiate with certain facilities, including the following:        Yes   Patient/family informed of bed offers received.  Patient chooses bed at [facility]     Physician recommends and patient chooses bed at      Patient to be transferred to [facility] on 01/29/16.  Patient to be transferred to facility by [facility]     Patient family notified on 01/29/16 of transfer.  Name of family member notified:  Pt's son, [name]     PHYSICIAN       Additional Comment:     _______________________________________________ Darden Dates, LCSW 01/29/2016, 4:22 PM

## 2016-01-29 NOTE — Care Management Note (Signed)
Case Management Note  Patient Details  Name: AVYANNA UNDERDOWN MRN: [id number] Date of Birth: 06-10-1931  Subjective/Objective:                    Action/Plan: Plan is for d/c to SNF rehab when patient is medically ready. CM following for further d/c needs.   Expected Discharge Date:                  Expected Discharge Plan:  Skilled Nursing Facility  In-House Referral:  Clinical Social Work  Discharge planning Services     Post Acute Care Choice:    Choice offered to:     DME Arranged:    DME Agency:     HH Arranged:    [place] Agency:     Status of Service:  In process, will continue to follow  If discussed at Long Length of Stay Meetings, dates discussed:    Additional Comments:  Pollie Friar, RN 01/29/2016, 11:01 AM

## 2016-01-29 NOTE — Progress Notes (Signed)
Physical Therapy Treatment Patient Details Name: Alexandra Bass MRN: [id number] DOB: 1931/06/04 Today's Date: 01/29/2016    History of Present Illness 82 yo female s/p fall and found down by family. CT (+) L PCA distribution L PCA hypoattenuating lesion CTA ( +) R ICA critical stenosis CTA head ( L supraclinoid iCA with carotid terminus thromboembolism) MRI(+) L PCA involving L thalamus and temporal lobe.    PT Comments    Pt progressing towards physical therapy goals. Was able to ambulate with less assistance this session however was limited by several bouts of urinary and bowel incontinence. Noted that pt acknowledging the R side with less cueing this session, however continues to demonstrate R side inattention. Will continue to follow and progress as able per POC.   Follow Up Recommendations  SNF;Supervision/Assistance - 24 hour     Equipment Recommendations  None recommended by PT (TBD by next venue of care)    Recommendations for Other Services       Precautions / Restrictions Precautions Precautions: Fall Precaution Comments: R inattention Restrictions Weight Bearing Restrictions: No    Mobility  Bed Mobility               General bed mobility comments: Pt sitting up in the recliner upon PT arrival.   Transfers Overall transfer level: Needs assistance Equipment used: 2 person hand held assist;Rolling walker (2 wheeled) Transfers: Sit to/from Stand Sit to Stand: Mod assist;+2 physical assistance Stand pivot transfers: +2 physical assistance;Mod assist       General transfer comment: Verbal and tactile cues for hand placement on seated surface for safety. Pt was able to power-up to full standing position with assist at the gait belt for balance support and boost up.   Ambulation/Gait Ambulation/Gait assistance: Mod assist;+2 physical assistance Ambulation Distance (Feet): 6 Feet Assistive device: Rolling walker (2 wheeled) Gait Pattern/deviations: Step-to  pattern;Decreased stride length;Trunk flexed Gait velocity: Decreased Gait velocity interpretation: Below normal speed for age/gender General Gait Details: Pt with R side lean with continued inattention to the R side. Pt was able to advance LE's this session without therapist assist for weight shifting, however had difficulty maneuvering walker and positioning herself in front of the sink to wash hands.    Stairs            Wheelchair Mobility    Modified Rankin (Stroke Patients Only) Modified Rankin (Stroke Patients Only) Pre-Morbid Rankin Score: No symptoms Modified Rankin: Moderately severe disability     Balance Overall balance assessment: Needs assistance Sitting-balance support: Feet supported;No upper extremity supported Sitting balance-Leahy Scale: Poor Sitting balance - Comments: Requires UE support to maintain upright sitting posture Postural control: Posterior lean;Right lateral lean Standing balance support: Bilateral upper extremity supported;During functional activity Standing balance-Leahy Scale: Poor                      Cognition Arousal/Alertness: Awake/alert Behavior During Therapy: Flat affect Overall Cognitive Status: Impaired/Different from baseline Area of Impairment: Orientation;Attention;Following commands;Safety/judgement;Awareness;Problem solving Orientation Level: Disoriented to;Place;Time;Situation;Person Current Attention Level: Focused   Following Commands: Follows one step commands inconsistently;Follows one step commands with increased time Safety/Judgement: Decreased awareness of safety;Decreased awareness of deficits Awareness: Intellectual Problem Solving: Slow processing;Difficulty sequencing General Comments: Pt was able to verbalize more today however not able to carry on a conversation. When asked if she was done using the bathroom she said "I think I am".     Exercises      General Comments  Pertinent  Vitals/Pain Pain Assessment: Faces Faces Pain Scale: No hurt    Home Living                      Prior Function            PT Goals (current goals can now be found in the care plan section) Acute Rehab PT Goals Patient Stated Goal: Pt did not state goals at this time PT Goal Formulation: Patient unable to participate in goal setting Time For Goal Achievement: 02/08/16 Potential to Achieve Goals: Fair Progress towards PT goals: Progressing toward goals    Frequency    Min 3X/week      PT Plan Current plan remains appropriate    Co-evaluation             End of Session Equipment Utilized During Treatment: Gait belt Activity Tolerance: Patient limited by fatigue Patient left: in chair;with chair alarm set;with call bell/phone within reach     Time: [id number] PT Time Calculation (min) (ACUTE ONLY): 30 min  Charges:  $Gait Training: 8-22 mins $Therapeutic Activity: 8-22 mins                    G Codes:      Thelma Comp February 05, 2016, 12:51 PM   Rolinda Roan, PT, DPT Acute Rehabilitation Services Pager: [phone]

## 2016-01-29 NOTE — Care Management Note (Signed)
Case Management Note  Patient Details  Name: Alexandra Bass MRN: [id number] Date of Birth: 01/23/1932  Subjective/Objective:                    Action/Plan: Pt discharging to [facility]. No further needs per CM.   Expected Discharge Date:                  Expected Discharge Plan:  Skilled Nursing Facility  In-House Referral:  Clinical Social Work  Discharge planning Services     Post Acute Care Choice:    Choice offered to:     DME Arranged:    DME Agency:     HH Arranged:    [place] Agency:     Status of Service:  Completed, signed off  If discussed at [employer], dates discussed:    Additional Comments:  Pollie Friar, RN 01/29/2016, 3:51 PM

## 2016-01-29 NOTE — Progress Notes (Signed)
Occupational Therapy Treatment Patient Details Name:  RANFT MRN: [id number] DOB: May 03, 1931 Today's Date: 01/29/2016    History of present illness 82 yo female s/p fall and found down by family. CT (+) L PCA distribution L PCA hypoattenuating lesion CTA ( +) R ICA critical stenosis CTA head ( L supraclinoid iCA with carotid terminus thromboembolism) MRI(+) L PCA involving L thalamus and temporal lobe.   OT comments  Focus of session on attending to R visual field and R UE use seated EOB. Pt requiring mod to max assist for bed mobility and assist to maintain sitting balance at EOB. Will continue to follow.  Follow Up Recommendations  SNF    Equipment Recommendations  3 in 1 bedside commode;Hospital bed;Wheelchair cushion (measurements OT);Wheelchair (measurements OT)    Recommendations for Other Services      Precautions / Restrictions Precautions Precautions: Fall Precaution Comments: R inattention Restrictions Weight Bearing Restrictions: No       Mobility Bed Mobility Overal bed mobility: Needs Assistance Bed Mobility: Supine to Sit;Sit to Supine     Supine to sit: Max assist Sit to supine: Mod assist   General bed mobility comments: assist to raise trunk and pivot hips to EOB, assist for LEs back into bed and to reposition  Transfers     Balance Overall balance assessment: Needs assistance Sitting-balance support: Feet supported;No upper extremity supported Sitting balance-Leahy Scale: Poor Sitting balance - Comments: Requires  one UE support to maintain upright sitting posture Postural control: Posterior lean;Right lateral lean                      ADL Overall ADL's : Needs assistance/impaired     Grooming: Sitting;Brushing hair;Maximal assistance (applying lotion to hands) Grooming Details (indicate cue type and reason): seated at EOB with table in front                               General ADL Comments: Placed ADL items  across midline and asked that pt locate each. Pt able to scan entire tray and locate items on R with head turn.       Vision                 Additional Comments: pt with inattention to R side, difficult to formally test due to impaired cognition   Perception     Praxis      Cognition   Behavior During Therapy: Flat affect Overall Cognitive Status: Impaired/Different from baseline Area of Impairment: Orientation;Attention;Following commands;Safety/judgement;Awareness;Problem solving Orientation Level: Disoriented to;Place;Time;Situation;Person Current Attention Level: Focused    Following Commands: Follows one step commands inconsistently;Follows one step commands with increased time Safety/Judgement: Decreased awareness of safety;Decreased awareness of deficits Awareness: Intellectual Problem Solving: Slow processing;Difficulty sequencing     Extremity/Trunk Assessment               Exercises Other Exercises Other Exercises: worked on reaching with R hand to visual targets during ADL.   Shoulder Instructions       General Comments      Pertinent Vitals/ Pain       Pain Assessment: Faces Faces Pain Scale: No hurt  Home Living                                          Prior Functioning/Environment  Frequency  Min 3X/week        Progress Toward Goals  OT Goals(current goals can now be found in the care plan section)  Progress towards OT goals: Progressing toward goals  Acute Rehab OT Goals Patient Stated Goal: Pt did not state goals at this time OT Goal Formulation: Patient unable to participate in goal setting Time For Goal Achievement: 02/08/16 Potential to Achieve Goals: [place] Discharge plan remains appropriate    Co-evaluation                 End of Session     Activity Tolerance Patient tolerated treatment well   Patient Left in bed;with call bell/phone within reach;with bed alarm  set   Nurse Communication          Time: [id number] OT Time Calculation (min): 19 min  Charges: OT General Charges $OT Visit: 1 Procedure OT Treatments $Therapeutic Activity: 8-22 mins  Malka So 01/29/2016, 3:22 PM  [phone]

## 2016-01-29 NOTE — Progress Notes (Signed)
[facility]AR wheeled patient out of the unit.

## 2016-01-29 NOTE — Progress Notes (Signed)
Patient is d/c to a skilled nursing facility. Report called to the receiving. Patient awaiting transportation.

## 2016-01-30 ENCOUNTER — Encounter (HOSPITAL_COMMUNITY): Payer: Self-pay | Admitting: Interventional Radiology

## 2016-01-31 ENCOUNTER — Telehealth (HOSPITAL_COMMUNITY): Payer: Self-pay

## 2016-01-31 NOTE — Telephone Encounter (Signed)
Called to schedule consult, no answer, no vm. AW  

## 2016-02-04 DIAGNOSIS — R5381 Other malaise: Secondary | ICD-10-CM | POA: Diagnosis not present

## 2016-02-04 DIAGNOSIS — R1310 Dysphagia, unspecified: Secondary | ICD-10-CM | POA: Diagnosis not present

## 2016-02-04 DIAGNOSIS — I739 Peripheral vascular disease, unspecified: Secondary | ICD-10-CM | POA: Diagnosis not present

## 2016-02-04 DIAGNOSIS — I639 Cerebral infarction, unspecified: Secondary | ICD-10-CM | POA: Diagnosis not present

## 2016-02-05 ENCOUNTER — Telehealth (HOSPITAL_COMMUNITY): Payer: Self-pay

## 2016-02-05 ENCOUNTER — Other Ambulatory Visit (HOSPITAL_COMMUNITY): Payer: Self-pay | Admitting: Interventional Radiology

## 2016-02-05 DIAGNOSIS — I771 Stricture of artery: Secondary | ICD-10-CM

## 2016-02-05 NOTE — Telephone Encounter (Signed)
Called to schedule f/u, phone busy. Will try later. AW

## 2016-02-20 ENCOUNTER — Ambulatory Visit (HOSPITAL_COMMUNITY): Admission: RE | Admit: 2016-02-20 | Payer: Commercial Managed Care - HMO | Source: Ambulatory Visit

## 2016-03-17 ENCOUNTER — Ambulatory Visit: Payer: Commercial Managed Care - HMO | Admitting: Surgery

## 2016-03-17 ENCOUNTER — Encounter (HOSPITAL_COMMUNITY): Payer: Commercial Managed Care - HMO

## 2016-03-20 ENCOUNTER — Ambulatory Visit: Payer: Self-pay | Admitting: Neurology

## 2016-03-24 ENCOUNTER — Encounter: Payer: Self-pay | Admitting: Neurology

## 2018-08-26 IMAGING — CT CT HEAD W/O CM
4 series · 18 of 47 positions shown, 20 images · non-contrast
Comparison: Most recent head CT 01/22/2016

CLINICAL DATA: Unwitnessed fall.  Found on floor.  Recent stroke.

EXAM:
CT HEAD WITHOUT CONTRAST
TECHNIQUE: Contiguous axial images were obtained from the base of the skull
through the vertex without intravenous contrast.

[Series 201: head w/o, idose (1) · axial · non-contrast · 0.49mm/px · z∈[+66,+176]mm · 8 of 30 slices shown, 10 images]
[im 4/30  brain]
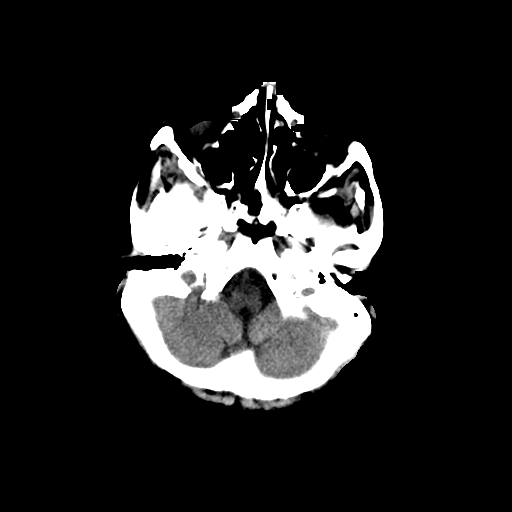
[im 4/30  bone]
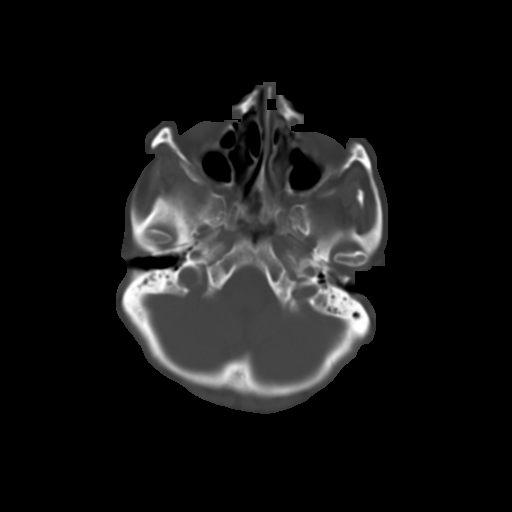
[im 7/30  brain]
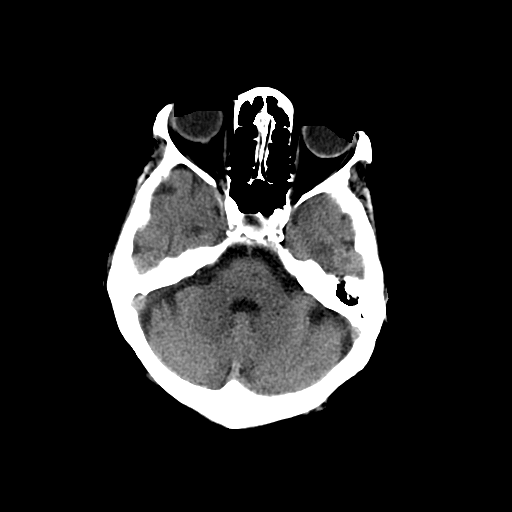
[im 10/30  brain]
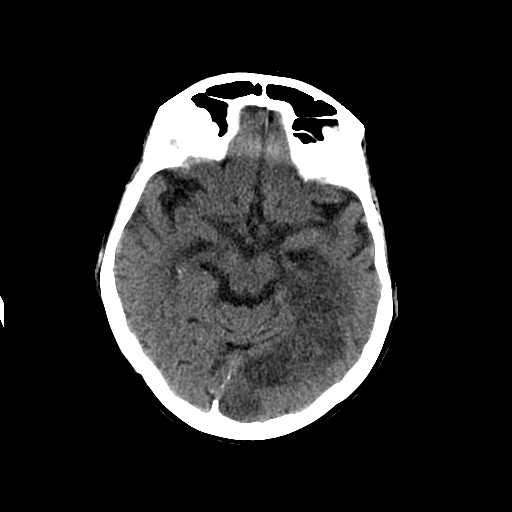
[im 13/30  brain]
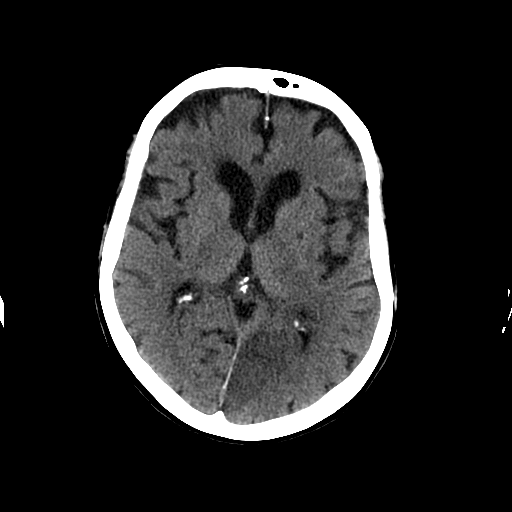
[im 17/30  brain]
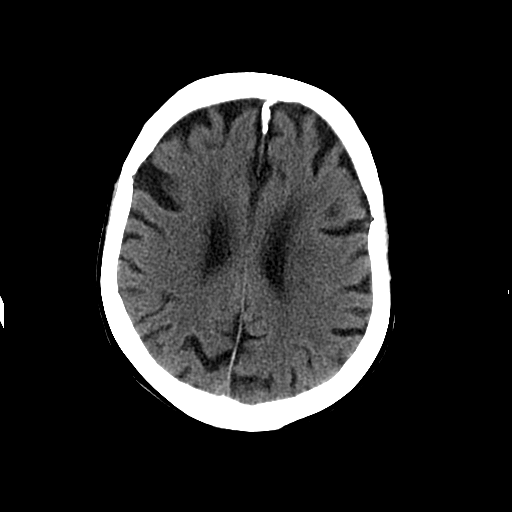
[im 17/30  bone]
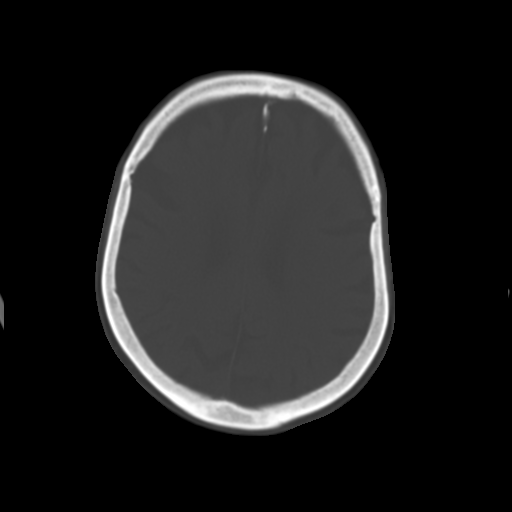
[im 20/30  brain]
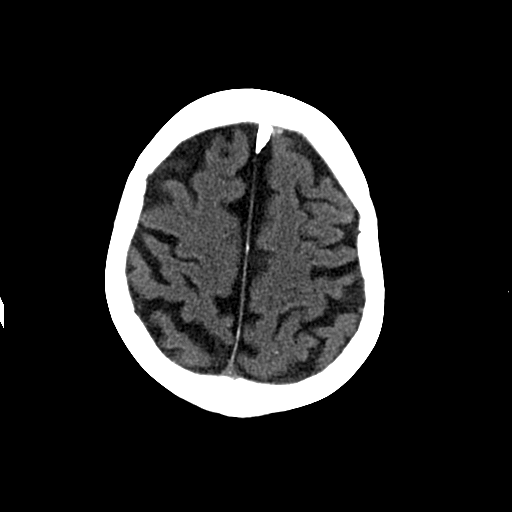
[im 23/30  brain]
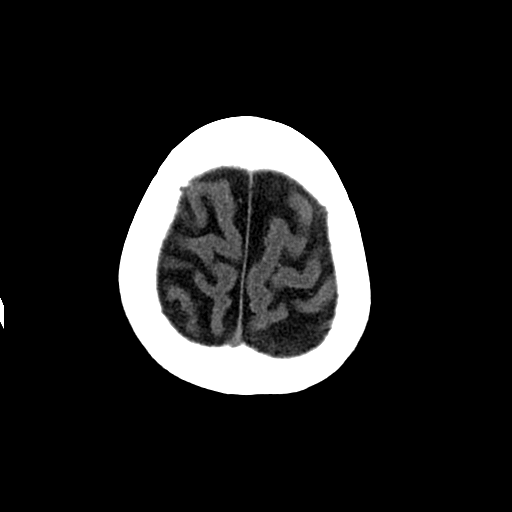
[im 26/30  brain]
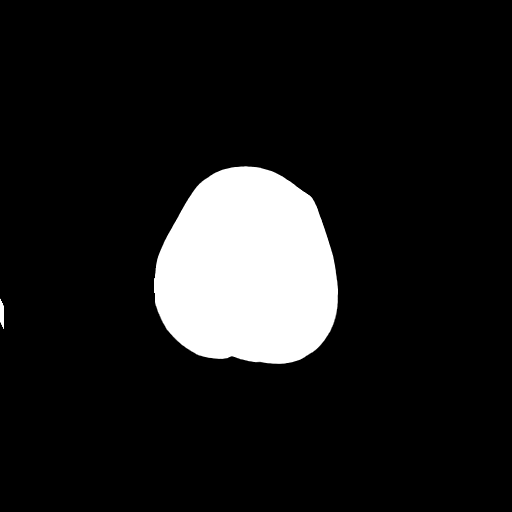

[Series 202: head w/o bone, idose (1) · axial · non-contrast · 0.49mm/px · z∈[+64,+109]mm · 4 of 60 slices shown]
[im 7/60  bone]
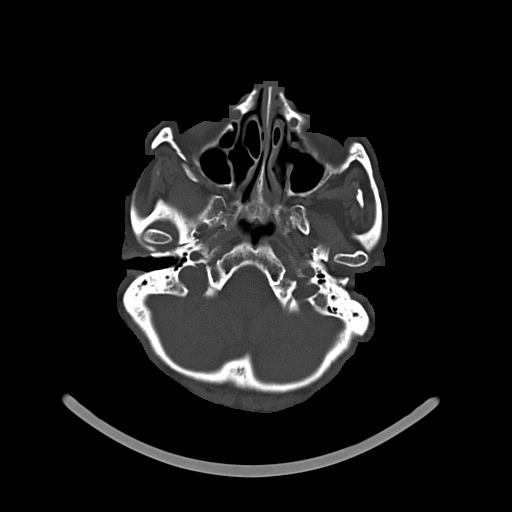
[im 13/60  bone]
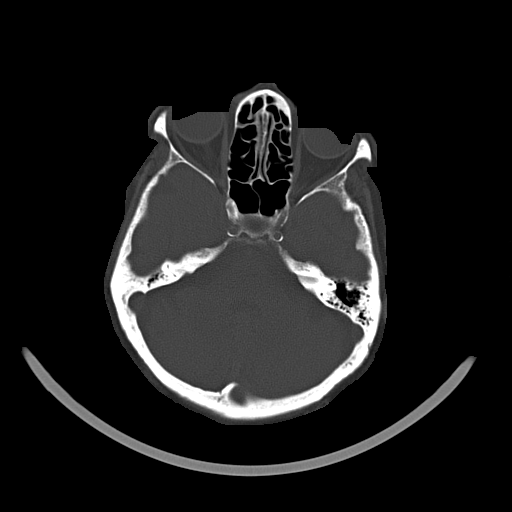
[im 19/60  bone]
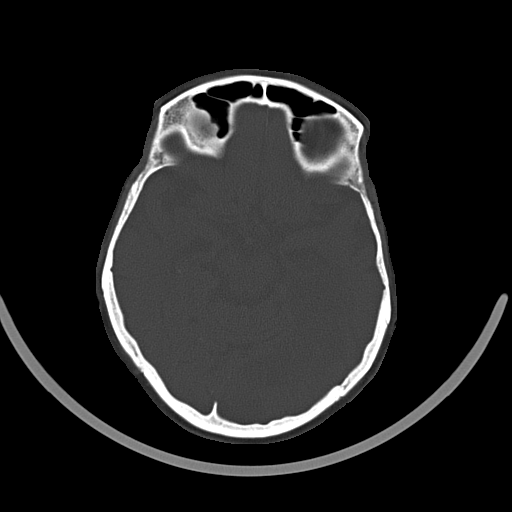
[im 25/60  bone]
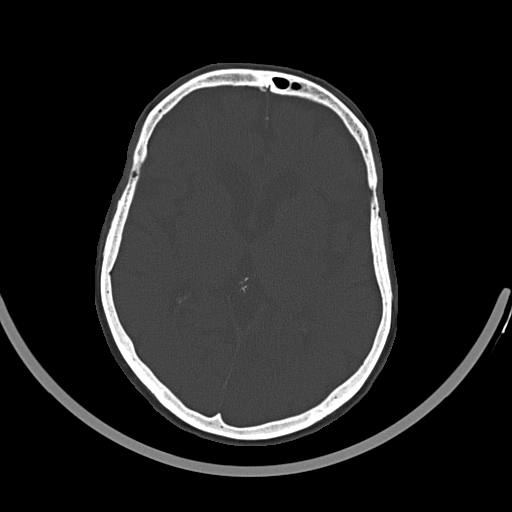

[Series 203: coronal st, idose (1) · coronal · 0.40mm/px · 3 of 70 slices shown]
[im 24/70  brain]
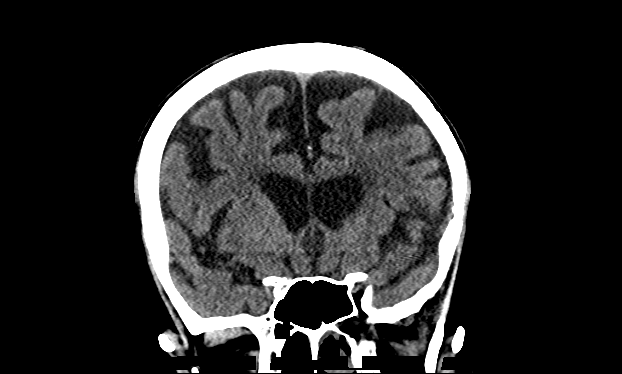
[im 31/70  brain]
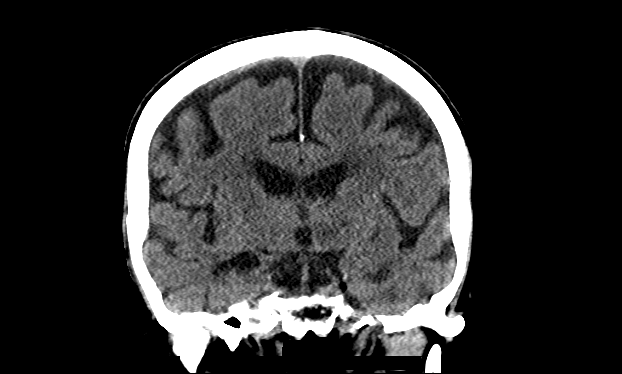
[im 39/70  brain]
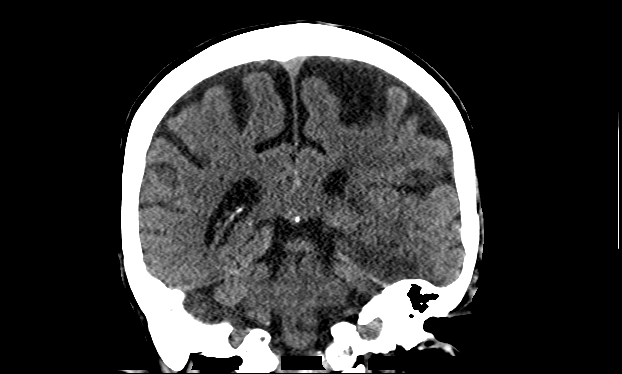

[Series 204: sagittal st, idose (1) · sagittal · 0.40mm/px · 3 of 82 slices shown]
[im 28/82  brain]
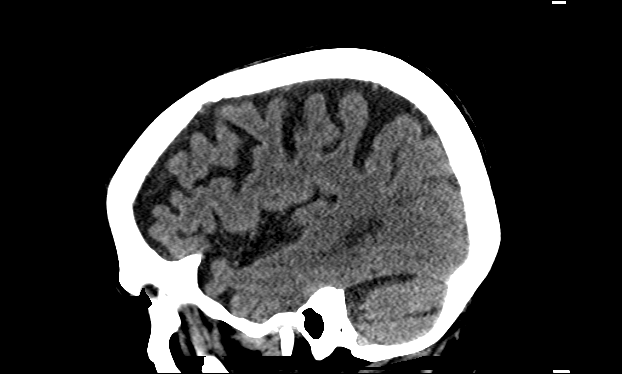
[im 41/82  brain]
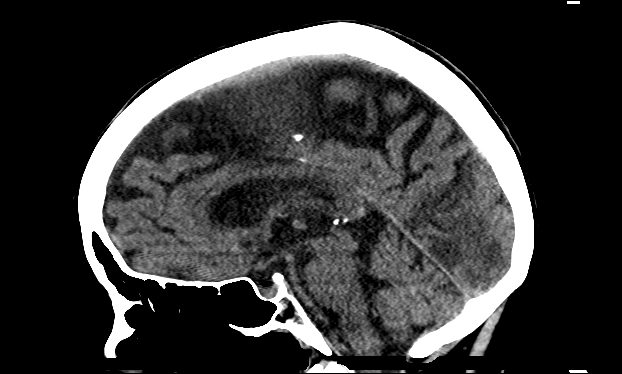
[im 55/82  brain]
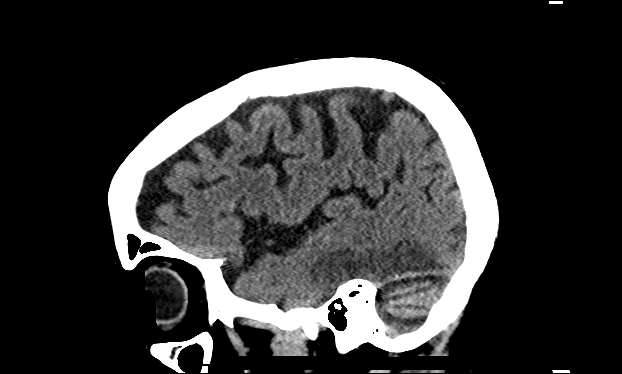

[18 of 47 positions shown; findings below may reference images not displayed]

FINDINGS: Brain: Expected evolution of left PCA distribution infarct in the
temporal occipital lobe from prior exam with associated mild sulcal
effacement. No hemorrhagic transformation. No midline shift. No new
ischemia. No hemorrhage elsewhere. No subdural or extra-axial fluid
collection. Stable atrophy and chronic small vessel ischemia.

Vascular: No hyperdense vessel. Carotid terminus thromboembolism on
prior CTA is not seen on noncontrast exam. There is skullbase
atherosclerosis.

Skull: Normal. Negative for fracture or focal lesion.

Sinuses/Orbits: Scattered mucosal thickening of the ethmoid air
cells. No sinus fluid levels. No mastoid air cell opacification.

Other: None.
IMPRESSION: Expected evolution of left PCA territory infarct. No hemorrhage
transformation. No new abnormality is seen.
